# Patient Record
Sex: Female | Born: 1995 | Race: White | Hispanic: No | Smoking: Never smoker
Health system: Southern US, Community
[De-identification: ages and names within clinical notes are randomized; demographics above are authoritative.]

## PROBLEM LIST (undated history)

## (undated) DIAGNOSIS — F329 Major depressive disorder, single episode, unspecified: Secondary | ICD-10-CM

## (undated) DIAGNOSIS — F32A Depression, unspecified: Secondary | ICD-10-CM

## (undated) DIAGNOSIS — F419 Anxiety disorder, unspecified: Secondary | ICD-10-CM

## (undated) HISTORY — DX: Depression, unspecified: F32.A

## (undated) HISTORY — DX: Major depressive disorder, single episode, unspecified: F32.9

## (undated) HISTORY — DX: Anxiety disorder, unspecified: F41.9

## (undated) HISTORY — PX: WISDOM TOOTH EXTRACTION: SHX21

---

## 2017-01-17 ENCOUNTER — Emergency Department (HOSPITAL_COMMUNITY)
Admission: EM | Admit: 2017-01-17 | Discharge: 2017-01-18 | Disposition: A | Discharge status: Home / Self Care | Payer: Managed Care, Other (non HMO) | Attending: Emergency Medicine | Admitting: Emergency Medicine

## 2017-01-17 ENCOUNTER — Encounter (HOSPITAL_COMMUNITY): Payer: Self-pay | Admitting: Nurse Practitioner

## 2017-01-17 DIAGNOSIS — Z79899 Other long term (current) drug therapy: Secondary | ICD-10-CM | POA: Diagnosis not present

## 2017-01-17 DIAGNOSIS — R197 Diarrhea, unspecified: Secondary | ICD-10-CM | POA: Diagnosis not present

## 2017-01-17 DIAGNOSIS — K602 Anal fissure, unspecified: Secondary | ICD-10-CM | POA: Insufficient documentation

## 2017-01-17 DIAGNOSIS — R112 Nausea with vomiting, unspecified: Secondary | ICD-10-CM | POA: Insufficient documentation

## 2017-01-17 DIAGNOSIS — R1011 Right upper quadrant pain: Secondary | ICD-10-CM | POA: Insufficient documentation

## 2017-01-17 DIAGNOSIS — Z880 Allergy status to penicillin: Secondary | ICD-10-CM | POA: Insufficient documentation

## 2017-01-17 LAB — COMPREHENSIVE METABOLIC PANEL
ALBUMIN: 3.5 g/dL (ref 3.5–5.0)
ALK PHOS: 44 U/L (ref 38–126)
ALT: 38 U/L (ref 14–54)
AST: 27 U/L (ref 15–41)
Anion gap: 5 (ref 5–15)
BILIRUBIN TOTAL: 0.4 mg/dL (ref 0.3–1.2)
CALCIUM: 9 mg/dL (ref 8.9–10.3)
CO2: 24 mmol/L (ref 22–32)
CREATININE: 0.66 mg/dL (ref 0.44–1.00)
Chloride: 110 mmol/L (ref 101–111)
GFR calc Af Amer: >60 mL/min (ref >60)
GFR calc non Af Amer: >60 mL/min (ref >60)
GLUCOSE: 101 mg/dL — AB (ref 65–99)
Potassium: 3.9 mmol/L (ref 3.5–5.1)
SODIUM: 139 mmol/L (ref 135–145)
TOTAL PROTEIN: 6.2 g/dL — AB (ref 6.5–8.1)

## 2017-01-17 LAB — URINALYSIS, ROUTINE W REFLEX MICROSCOPIC
Bilirubin Urine: NEGATIVE
GLUCOSE, UA: NEGATIVE mg/dL
Ketones, ur: NEGATIVE mg/dL
Leukocytes, UA: NEGATIVE
NITRITE: NEGATIVE
PROTEIN: NEGATIVE mg/dL
SPECIFIC GRAVITY, URINE: 1.014 (ref 1.005–1.030)
WBC UA: NONE SEEN WBC/hpf (ref 0–5)
pH: 6 (ref 5.0–8.0)

## 2017-01-17 LAB — CBC
HCT: 38.2 % (ref 36.0–46.0)
Hemoglobin: 13 g/dL (ref 12.0–15.0)
MCH: 31.6 pg (ref 26.0–34.0)
MCHC: 34 g/dL (ref 30.0–36.0)
MCV: 92.7 fL (ref 78.0–100.0)
Platelets: 240 10*3/uL (ref 150–400)
RBC: 4.12 MIL/uL (ref 3.87–5.11)
RDW: 11.9 % (ref 11.5–15.5)
WBC: 4.5 10*3/uL (ref 4.0–10.5)

## 2017-01-17 LAB — I-STAT BETA HCG BLOOD, ED (MC, WL, AP ONLY)

## 2017-01-17 NOTE — ED Triage Notes (Signed)
Pt presents with c/o abdominal pain. The pain began about two weeks ago and has been increasingly worse since onset. Shes also had nausea, vomiting, bloody stools this week and she reports about 20 lb weight loss since the beginning of august.

## 2017-01-18 ENCOUNTER — Emergency Department (HOSPITAL_COMMUNITY): Payer: Managed Care, Other (non HMO)

## 2017-01-18 LAB — LIPASE, BLOOD: LIPASE: 32 U/L (ref 11–51)

## 2017-01-18 MED ORDER — DICYCLOMINE HCL 20 MG PO TABS: 20.0000 mg | ORAL_TABLET | Freq: Two times a day (BID) | ORAL | 0 refills | Status: DC

## 2017-01-18 MED ORDER — OMEPRAZOLE 20 MG PO CPDR: 20.0000 mg | DELAYED_RELEASE_CAPSULE | Freq: Every day | ORAL | 0 refills | Status: DC

## 2017-01-18 MED ORDER — KETOROLAC TROMETHAMINE 30 MG/ML IJ SOLN
30.0000 mg | Freq: Once | INTRAMUSCULAR | Status: AC
  Administered 2017-01-18: 30 mg via INTRAVENOUS
  Filled 2017-01-18: qty 1

## 2017-01-18 MED ORDER — SODIUM CHLORIDE 0.9 % IV BOLUS (SEPSIS)
1000.0000 mL | Freq: Once | INTRAVENOUS | Status: AC
  Administered 2017-01-18: 1000 mL via INTRAVENOUS

## 2017-01-18 MED ORDER — ONDANSETRON HCL 4 MG/2ML IJ SOLN
4.0000 mg | Freq: Once | INTRAMUSCULAR | Status: AC
  Administered 2017-01-18: 4 mg via INTRAVENOUS
  Filled 2017-01-18: qty 2

## 2017-01-18 MED ORDER — PROMETHAZINE HCL 25 MG PO TABS: 25.0000 mg | ORAL_TABLET | Freq: Four times a day (QID) | ORAL | 0 refills | Status: DC | PRN

## 2017-01-18 MED ORDER — MORPHINE SULFATE (PF) 4 MG/ML IV SOLN
4.0000 mg | Freq: Once | INTRAVENOUS | Status: AC
  Administered 2017-01-18: 4 mg via INTRAVENOUS
  Filled 2017-01-18: qty 1

## 2017-01-18 NOTE — ED Notes (Signed)
Patient transported to Ultrasound 

## 2017-01-18 NOTE — ED Notes (Signed)
Patient transported to CT 

## 2017-01-18 NOTE — ED Provider Notes (Signed)
195:722 AM 21 year old female assumed in care [place]. Patient presenting for multiple nonspecific complaints. She notes 2 weeks of worsening right upper quadrant pain with nausea and vomiting. This is worse with eating. She has also had watery diarrhea. Workup today revealed hematuria. Patient did state that she was starting her menstrual cycle soon. Otherwise, labs reassuring. No leukocytosis. Vital stable.  After reassuring ultrasound, CT ordered to evaluate for kidney stone. This is negative. There is no evidence of acute abdominal or pelvic process on CT today. Patient sleeping on repeat assessment. She is in no visible or audible signs of discomfort. Will refer to gastroenterology for follow-up should symptoms persist. Supportive management advised with return if symptoms worsen. Patient discharged in stable condition with no unaddressed concerns.    Results for orders placed or performed during the hospital encounter of 01/17/17  Comprehensive metabolic panel  Result Value Ref Range   Sodium 139 135 - 145 mmol/L   Potassium 3.9 3.5 - 5.1 mmol/L   Chloride 110 101 - 111 mmol/L   CO2 24 22 - 32 mmol/L   Glucose, Bld 101 (H) 65 - 99 mg/dL   BUN <5 (L) 6 - 20 mg/dL   Creatinine, Ser 1.610.66 0.44 - 1.00 mg/dL   Calcium 9.0 8.9 - 09.610.3 mg/dL   Total Protein 6.2 (L) 6.5 - 8.1 g/dL   Albumin 3.5 3.5 - 5.0 g/dL   AST 27 15 - 41 U/L   ALT 38 14 - 54 U/L   Alkaline Phosphatase 44 38 - 126 U/L   Total Bilirubin 0.4 0.3 - 1.2 mg/dL   GFR calc non Af Amer >60 >60 mL/min   GFR calc Af Amer >60 >60 mL/min   Anion gap 5 5 - 15  CBC  Result Value Ref Range   WBC 4.5 4.0 - 10.5 K/uL   RBC 4.12 3.87 - 5.11 MIL/uL   Hemoglobin 13.0 12.0 - 15.0 g/dL   HCT 04.538.2 40.936.0 - 81.146.0 %   MCV 92.7 78.0 - 100.0 fL   MCH 31.6 26.0 - 34.0 pg   MCHC 34.0 30.0 - 36.0 g/dL   RDW 91.411.9 78.211.5 - 95.615.5 %   Platelets 240 150 - 400 K/uL  Urinalysis, Routine w reflex microscopic  Result Value  Ref Range   Color, Urine YELLOW YELLOW   APPearance HAZY (A) CLEAR   Specific Gravity, Urine 1.014 1.005 - 1.030   pH 6.0 5.0 - 8.0   Glucose, UA NEGATIVE NEGATIVE mg/dL   Hgb urine dipstick LARGE (A) NEGATIVE   Bilirubin Urine NEGATIVE NEGATIVE   Ketones, ur NEGATIVE NEGATIVE mg/dL   Protein, ur NEGATIVE NEGATIVE mg/dL   Nitrite NEGATIVE NEGATIVE   Leukocytes, UA NEGATIVE NEGATIVE   RBC / HPF TOO NUMEROUS TO COUNT 0 - 5 RBC/hpf   WBC, UA NONE SEEN 0 - 5 WBC/hpf   Bacteria, UA RARE (A) NONE SEEN   Squamous Epithelial / LPF 0-5 (A) NONE SEEN   Mucous PRESENT   Lipase, blood  Result Value Ref Range   Lipase 32 11 - 51 U/L  I-Stat beta hCG blood, ED  Result Value Ref Range   I-stat hCG, quantitative <5.0 <5 mIU/mL   Comment 3           [place] Abdomen Limited  Result Date: 01/18/2017 CLINICAL DATA:  Subacute onset of right upper quadrant abdominal pain. Initial encounter. EXAM: ULTRASOUND ABDOMEN LIMITED RIGHT UPPER QUADRANT COMPARISON:  None. FINDINGS: Gallbladder: No gallstones  or wall thickening visualized. No sonographic Murphy sign noted by sonographer, though evaluation for ultrasonographic Murphy's sign is limited as the patient is on pain medication. Common bile duct: Diameter: 0.3 cm, within normal limits in caliber. Liver: No focal lesion identified. Within normal limits in parenchymal echogenicity. IMPRESSION: Unremarkable ultrasound of the right upper quadrant. Electronically Signed   By: Roanna Raider M.D.   On: 01/18/2017 01:19   Ct Renal Stone Study  Result Date: 01/18/2017 CLINICAL DATA:  Abdominal pain with weight loss for 2 weeks bloody stool EXAM: CT ABDOMEN AND PELVIS WITHOUT CONTRAST TECHNIQUE: Multidetector CT imaging of the abdomen and pelvis was performed following the standard protocol without IV contrast. COMPARISON:  None. FINDINGS: Lower chest: No acute abnormality. Hepatobiliary: No focal liver abnormality is seen. No gallstones, gallbladder wall thickening, or  biliary dilatation. Pancreas: Unremarkable. No pancreatic ductal dilatation or surrounding inflammatory changes. Spleen: Normal in size without focal abnormality. Adrenals/Urinary Tract: Adrenal glands are unremarkable. Kidneys are normal, without renal calculi, focal lesion, or hydronephrosis. Bladder is unremarkable. Stomach/Bowel: Stomach is within normal limits. Appendix appears normal. No evidence of bowel wall thickening, distention, or inflammatory changes. Vascular/Lymphatic: No significant vascular findings are present. No enlarged abdominal or pelvic lymph nodes. Reproductive: Uterus and bilateral adnexa are unremarkable. Other: No abdominal wall hernia or abnormality. No abdominopelvic ascites. Musculoskeletal: No acute or significant osseous findings. IMPRESSION: No CT evidence for acute intra-abdominal or pelvic pathology. Electronically Signed   By: Jasmine Pang M.D.   On: 01/18/2017 03:52      Antony Madura, PA-C 01/18/17 1610    Zadie Rhine, MD 01/19/17 [id number]

## 2017-01-18 NOTE — ED Provider Notes (Signed)
[facility]-EMERGENCY DEPT Provider Note   CSN: [id number] Arrival date & time: 01/17/17  1750     History   Chief Complaint Chief Complaint  Patient presents with  . Abdominal Pain    HPI Brenda Hayes is a 21 y.o. female presenting with 2 weeks of worsening right upper quadrant pain with associated nausea vomiting. Watery non-bloody diarrhea for the last 2 days. Pain is aggravated by eating. Patient has not been able to eat over the past 2 weeks and reports significant weight loss. She reports subjective fevers. She states that she has been told in the past that she had an inflamed gallbladder but didn't require surgery. This pain is similar although more severe. She also reports noting bright red blood in her stool yesterday which is what got her concerned enough to come and be evaluated. Patient denies any history of abdominal surgeries.    HPI  History reviewed. No pertinent past medical history.  There are no active problems to display for this patient.   History reviewed. No pertinent surgical history.  OB History    No data available       Home Medications    Prior to Admission medications   Medication Sig Start Date End Date Taking? Authorizing Provider  albuterol (PROVENTIL HFA;VENTOLIN HFA) 108 (90 Base) MCG/ACT inhaler Inhale 1-2 puffs into the lungs every 6 (six) hours as needed for wheezing or shortness of breath.   Yes [provider]  albuterol (PROVENTIL) (2.5 MG/3ML) 0.083% nebulizer solution Take 2.5 mg by nebulization every 6 (six) hours as needed for wheezing or shortness of breath.   Yes [provider]  Norgestimate-Ethinyl Estradiol Triphasic (TRINESSA LO) 0.18/0.215/0.25 MG-25 MCG tab Take 1 tablet by mouth daily.   Yes [provider]    Family History History reviewed. No pertinent family history.  Social History Social History  Substance Use Topics  . Smoking status: Never Smoker  . Smokeless tobacco: Never Used  .  Alcohol use No     Allergies   Amoxicillin and Penicillins   Review of Systems Review of Systems  Constitutional: Positive for appetite change, fatigue, fever and unexpected weight change. Negative for chills.  Respiratory: Negative for cough, shortness of breath, wheezing and stridor.   Cardiovascular: Negative for chest pain, palpitations and leg swelling.  Gastrointestinal: Positive for abdominal pain, blood in stool, diarrhea, nausea and vomiting. Negative for abdominal distention.  Genitourinary: Negative for difficulty urinating, dysuria, flank pain, frequency, hematuria, pelvic pain and urgency.  Musculoskeletal: Negative for arthralgias, back pain, myalgias, neck pain and neck stiffness.  Skin: Negative for color change, pallor, rash and wound.  Neurological: Negative for dizziness, seizures, syncope, weakness and light-headedness.     Physical Exam Updated Vital Signs BP 136/76   Pulse 91   Temp 98.3 F (36.8 C) (Oral)   Resp 16   LMP 12/20/2016   SpO2 98%   Physical Exam  Constitutional: She appears well-developed and well-nourished. No distress.  She is afebrile, nontoxic-appearing, lying in bed in discomfort no acute distress.  HENT:  Head: Normocephalic and atraumatic.  Eyes: Conjunctivae are normal.  Neck: Neck supple.  Cardiovascular: Normal rate, regular rhythm and normal heart sounds.   No murmur heard. Pulmonary/Chest: Effort normal and breath sounds normal. No respiratory distress. She has no wheezes. She has no rales. She exhibits no tenderness.  Abdominal: Soft. Bowel sounds are normal. She exhibits no distension and no mass. There is tenderness. There is no rebound and no guarding.  Flat contour, active bowel sounds. abdomen is supple and tender to both light and deep palpation of RUQ. no rebound tenderness. No palpated masses. No costovertebral angle tenderness. Positive murphy's sign Negative McBurney's point tenderness  No peritoneal signs    Genitourinary:  Genitourinary Comments: Small anal fissure noted at 5 o'clock. Possible Internal hemorrhoid at 3 o'clock. No external hemorrhoid visualized or gross blood.  Musculoskeletal: Normal range of motion. She exhibits no edema.  Neurological: She is alert.  Skin: Skin is warm and dry. No rash noted. She is not diaphoretic. No erythema. No pallor.  Psychiatric: She has a normal mood and affect.  Nursing note and vitals reviewed.    ED Treatments / Results  Labs (all labs ordered are listed, but only abnormal results are displayed) Labs Reviewed  COMPREHENSIVE METABOLIC PANEL - Abnormal; Notable for the following:       Result Value   Glucose, Bld 101 (*)    BUN <5 (*)    Total Protein 6.2 (*)    All other components within normal limits  URINALYSIS, ROUTINE W REFLEX MICROSCOPIC - Abnormal; Notable for the following:    APPearance HAZY (*)    Hgb urine dipstick LARGE (*)    Bacteria, UA RARE (*)    Squamous Epithelial / LPF 0-5 (*)    All other components within normal limits  CBC  LIPASE, BLOOD  I-STAT BETA HCG BLOOD, ED ([facility], [facility], [facility] ONLY)  POC OCCULT BLOOD, ED    EKG  EKG Interpretation None       Radiology No results found.  Procedures Procedures (including critical care time)  Medications Ordered in ED Medications  ondansetron (ZOFRAN) injection 4 mg (4 mg Intravenous Given 01/18/17 0027)  sodium chloride 0.9 % bolus 1,000 mL (1,000 mLs Intravenous New Bag/Given 01/18/17 0027)  morphine 4 MG/ML injection 4 mg (4 mg Intravenous Given 01/18/17 0027)     Initial Impression / Assessment and Plan / ED Course  I have reviewed the triage vital signs and the nursing notes.  Pertinent labs & imaging results that were available during my care of the patient were reviewed by me and considered in my medical decision making (see chart for details).    Patient presents with worsening right upper quadrant pain with postprandial aggravation, similar to previous  biliary colic but more severe.  She has also been vomiting and has had diarrhea for the past 2 days. Also reports noticing blood in toilet bowl once yesterday.  Fissure and possible internal hemorrhoid on exam. Positive murphy's sign.  Suspect cholecystitis. Ordered RUQ ultrasound, IV fluids, analgesia, antiemetic and will reassess.  Normal vital signs, hemodynamically stable. Labs unremarkable. Pending Hemoccult and lipase. Pregnancy negative.  Transferred patient care at end of shift [facility]-C pending RUQ US and completion of IV fluids. Patient may need CT to evaluate for nephrolithiasis. Hematuria on U/A Interventions and dispo dependent on imaging. Final Clinical Impressions(s) / ED Diagnoses   Final diagnoses:  Postprandial RUQ pain  Nausea vomiting and diarrhea  Anal fissure    New Prescriptions New Prescriptions   No medications on file     Gregary CromerMitchell, Jessica B, PA-C 01/18/17 0111    Zadie RhineWickline, Donald, MD 01/18/17 [id number]

## 2017-11-09 ENCOUNTER — Encounter: Payer: Self-pay | Admitting: Obstetrics and Gynecology

## 2017-11-09 ENCOUNTER — Ambulatory Visit (INDEPENDENT_AMBULATORY_CARE_PROVIDER_SITE_OTHER): Payer: Managed Care, Other (non HMO) | Admitting: Obstetrics and Gynecology

## 2017-11-09 VITALS — BP 130/80 | HR 111 | Ht 62.0 in | Wt 197.0 lb

## 2017-11-09 DIAGNOSIS — Z3169 Encounter for other general counseling and advice on procreation: Secondary | ICD-10-CM

## 2017-11-09 DIAGNOSIS — Z818 Family history of other mental and behavioral disorders: Secondary | ICD-10-CM

## 2017-11-09 DIAGNOSIS — N979 Female infertility, unspecified: Secondary | ICD-10-CM

## 2017-11-09 NOTE — Progress Notes (Signed)
Gynecology H&P  PCP: Patient, No Pcp Per  Chief Complaint:  Chief Complaint  Patient presents with  . Consult    Fertility consultation     History of Present Illness: Patient is a 22 y.o. G1P0010 presenting for evaluation of infertility. Patient and partner have been attempting unprotected intercourse for 3 years and actively trying for conception for 3 years.  IPregnancies with current partner yes  Sexual History Dyspareunia: no Use of Lubricant: no Douching: no  Ovulatory Evaluation LMP: Patient's last menstrual period was 10/23/2017 (exact date). Menarche:14 Interval regular 28  days Duration of flow: 6-8 days Heavy Menses: no Clots: no Intermenstrual Bleeding: no Dysmenorrhea: no  Molimina no Ovulation Predictor Kits Positive  no  PCOS  Wt Change: yes, started at 1-2 years ago 45-55lbs Hirsutism: no Acne: no  Hyperprolactinemia Galactorrhea: no Headaches: no Vision Changes:  no  Thyroid Temperature Intolerance: no Constipation or Diarrhea: no Hair Thinning:  no Palpitation:  no  Prior treatments Meds: none Other Therapies: Not applicable  Premature Ovarian Failure Family history of autism, mental retardation, or fragile X: yes several female cousins with autism Family history of premature ovarian failure or early menopause: no Prior radiation or chemotherapy exposoure:  no  Tubal Factor Previous abdominal or pelvic surgery: no Pelvic Pain:  no Endometriosis: no STD: no PID: no Laparoscopy: no Prior HSG: no  Female Factor Sired prior conception:  Yes (fathered prior miscarriage) Semen analysis: no  Contraception none currently, briefly on OCP following miscarriage  Contributing Habits Cigarettes:    Wife -  no    Husband - no Alcohol:    Wife -  no    Husband - no  Review of Systems: 10 point review of systems negative unless otherwise noted in HPI  Past Medical History:  Past Medical History:  Diagnosis Date  . Anxiety   .  Depression     Past Surgical History:  Past Surgical History:  Procedure Laterality Date  . WISDOM TOOTH EXTRACTION     x4 extracted     Family History:  Family History  Problem Relation Age of Onset  . Breast cancer Paternal Aunt   . Breast cancer Maternal Grandmother    Social History:  Social History   Socioeconomic History  . Marital status: Married    Spouse name: Not on file  . Number of children: Not on file  . Years of education: Not on file  . Highest education level: Not on file  Occupational History  . Not on file  Social Needs  . Financial resource strain: Not on file  . Food insecurity:    Worry: Not on file    Inability: Not on file  . Transportation needs:    Medical: Not on file    Non-medical: Not on file  Tobacco Use  . Smoking status: Never Smoker  . Smokeless tobacco: Never Used  Substance and Sexual Activity  . Alcohol use: No  . Drug use: No  . Sexual activity: Yes    Birth control/protection: None  Lifestyle  . Physical activity:    Days per week: Not on file    Minutes per session: Not on file  . Stress: Not on file  Relationships  . Social connections:    Talks on phone: Not on file    Gets together: Not on file    Attends religious service: Not on file    Active member of club or organization: Not on file    Attends  meetings of clubs or organizations: Not on file    Relationship status: Not on file  . Intimate partner violence:    Fear of current or ex partner: Not on file    Emotionally abused: Not on file    Physically abused: Not on file    Forced sexual activity: Not on file  Other Topics Concern  . Not on file  Social History Narrative  . Not on file    Allergies:  Allergies  Allergen Reactions  . Amoxicillin Nausea And Vomiting  . Penicillins Anaphylaxis  . Eggs Or Egg-Derived Products Hives    Medications: Prior to Admission medications   Medication Sig Start Date End Date Taking? Authorizing Provider    albuterol (PROVENTIL HFA;VENTOLIN HFA) 108 (90 Base) MCG/ACT inhaler Inhale 1-2 puffs into the lungs every 6 (six) hours as needed for wheezing or shortness of breath.   Yes [provider]  albuterol (PROVENTIL) (2.5 MG/3ML) 0.083% nebulizer solution Take 2.5 mg by nebulization every 6 (six) hours as needed for wheezing or shortness of breath.   Yes [provider]  buPROPion (WELLBUTRIN SR) 100 MG 12 hr tablet TK 1 T PO QAM 10/16/17  Yes [provider]  LORazepam (ATIVAN) 1 MG tablet TK 1 T PO BID PRA AND PANIC 10/14/17  Yes [provider]  QUEtiapine (SEROQUEL) 50 MG tablet TK 1 T PO QHS PRN 10/14/17  Yes [provider]  dicyclomine (BENTYL) 20 MG tablet Take 1 tablet (20 mg total) by mouth 2 (two) times daily. Take as prescribed, as needed, for abdominal pain/cramping. Patient not taking: Reported on 11/09/2017 01/18/17   Antony Madura, PA-C  Norgestimate-Ethinyl Estradiol Triphasic (TRINESSA LO) 0.18/0.215/0.25 MG-25 MCG tab Take 1 tablet by mouth daily.    [provider]  omeprazole (PRILOSEC) 20 MG capsule Take 1 capsule (20 mg total) by mouth daily. Patient not taking: Reported on 11/09/2017 01/18/17   Antony Madura, PA-C  promethazine (PHENERGAN) 25 MG tablet Take 1 tablet (25 mg total) by mouth every 6 (six) hours as needed for nausea or vomiting. Patient not taking: Reported on 11/09/2017 01/18/17   Antony Madura, PA-C    Physical Exam Vitals: Blood pressure 130/80, pulse (!) 111, height 5\' 2"  (1.575 m), weight 197 lb (89.4 kg), last menstrual period 10/23/2017.  General: NAD HEENT: normocephalic, anicteric Pulmonary: No increased work of breathing Extremities: no edema, erythema, or tenderness Neurologic: Grossly intact Psychiatric: mood appropriate, affect full   Assessment: 22 y.o. G1P0010 presenting for initial infertility evaluatoin Plan: 1) We discussed the underlying etiologies which may be implicated in a couple experiencing  difficulty conceiving.  The average couple will conceive within the span of 1 year with unprotected coitus, with a monthly fecundity rate of 20% or 1 in 5.  Even without further work up or intervention the patient and her partner may be successful in conceiving unassisted, although if an underlying etiology can be identified and addressed fecundity rate may improve.  The work up entails examining for ovulatory function, tubal patency, and ruling out female factor infertility.  These may be looked at concurrently or sequentially.  The downside of sequential work up is that this method may miss issues if more than one compartment is contributing.  She is aware that tubal factor or moderate to severe female factor infertility will require further consultation with a reproductive endocrinologist.  In the case of anovulation, use of Clomid (clomiphen citrate) or Femara (letrazole) were discussed with the understanding the the later  is an off-label, but well supported use.  With either of these drugs the risk of multiples increases from the standard population rate of 2% to approximately 10%, with higher order multiples possible but unlikely.  Both drugs may require some time to titrate to the appropriate dosage to ensure consistent ovulation.  Cycles will be limited to 6 cycles on each drug secondary to decreasing rates of conception after 6 cycles.  In addition should patient be started on ovulation induction with Clomid she was advised to discontinue the drug for any vision changes as this is a rare but potentially permanent side-effect if medication is continued.  We discussed timing of intercourse as well as the use of ovulation predictor kits identify the patient's fertile window each month.     2) Preconception counseling - The patient denies any family history of conditions which would warrant preconception genetic counseling or testing on her or her partner.  Instructed to start prenatal vitamins while trying to  conceive.   - Fragile X  3) Return in about 1 week (around 11/16/2017) for TVUS infertility.    Vena Austria, MD, [facility], [facility]

## 2017-11-09 NOTE — Patient Instructions (Signed)
We discussed the underlying etiologies which may be implicated in a couple experiencing difficulty conceiving.  The average couple will conceive within the span of 1 year with unprotected coitus, with a monthly fecundity rate of 20% or 1 in 5.  Even without further work up or intervention the patient and her partner may be successful in conceiving unassisted, although if an underlying etiology can be identified and addressed fecundity rate may improve.  The work up entails examining for ovulatory function, tubal patency, and ruling out female factor infertility.  These may be looked at concurrently or sequentially.  The downside of sequential work up is that this method may miss issues if more than one compartment is contributing.  She is aware that tubal factor or moderate to severe female factor infertility will require further consultation with a reproductive endocrinologist.  In the case of anovulation, use of Clomid (clomiphen citrate) or Femara (letrazole) were discussed with the understanding the the later is an off-label, but well supported use.  Current recommendation are to use letrozole first line secondary to increased live birth rate compared to clomid for patients with PCOS ("Polycystic Ovarian Syndrome" ACOG Practice Bulletin 194 June 2018).  With either of these drugs the risk of multiples increases from the standard population rate of 2% to approximately 10%, with higher order multiples possible but unlikely.  Both drugs may require some time to titrate to the appropriate dosage to ensure consistent ovulation.  Cycles will be limited to 6 cycles on each drug secondary to decreasing rates of conception after 6 cycles.  In addition should patient be started on ovulation induction with Clomid she was advised to discontinue the drug for any vision changes as this is a rare but potentially permanent side-effect if medication is continued.  Was counseled that letrozole may cause idiopathic bone pain that  generally resolves.  Hot flashes, headaches, and nausea were mentioned as the most commonly encountered side-effects of both drugs.  We discussed timing of intercourse as well as the use of ovulation predictor kits identify the patient's fertile window each month.

## 2017-11-19 LAB — INHERITEST CORE(CF97,SMA,FRAX)

## 2017-11-19 LAB — TSH+PRL+FSH+TESTT+LH+DHEA S...
17-Hydroxyprogesterone: 60 ng/dL
ANDROSTENEDIONE: 184 ng/dL (ref 41–262)
DHEA-SO4: 438.8 ug/dL — ABNORMAL HIGH (ref 110.0–431.7)
FSH: 8 m[IU]/mL
LH: 29.1 m[IU]/mL
Prolactin: 12.8 ng/mL (ref 4.8–23.3)
TESTOSTERONE: 74 ng/dL — AB (ref 8–48)
TSH: 1.28 u[IU]/mL (ref 0.450–4.500)
Testosterone, Free: 3.7 pg/mL (ref 0.0–4.2)

## 2017-11-21 ENCOUNTER — Other Ambulatory Visit: Payer: Self-pay | Admitting: Obstetrics and Gynecology

## 2017-11-21 ENCOUNTER — Encounter (INDEPENDENT_AMBULATORY_CARE_PROVIDER_SITE_OTHER): Payer: Self-pay

## 2017-11-21 ENCOUNTER — Encounter: Payer: Self-pay | Admitting: Obstetrics and Gynecology

## 2017-11-21 DIAGNOSIS — N939 Abnormal uterine and vaginal bleeding, unspecified: Secondary | ICD-10-CM

## 2017-11-21 DIAGNOSIS — N970 Female infertility associated with anovulation: Secondary | ICD-10-CM

## 2017-11-21 NOTE — Telephone Encounter (Signed)
Patient is schedule 11/30/17 at 2pm for TVUS and follow up

## 2017-11-21 NOTE — Telephone Encounter (Signed)
-----   Message from Vena AustriaAndreas Staebler, MD sent at 11/21/2017  1:04 PM EDT ----- Regarding: Ultrasound Ultrasound in the next 1-3 weeks, order in

## 2017-11-22 ENCOUNTER — Encounter: Payer: Self-pay | Admitting: Obstetrics and Gynecology

## 2017-11-29 NOTE — Progress Notes (Signed)
Gynecology Ultrasound Follow Up  Chief Complaint:  Chief Complaint  Patient presents with  . Follow-up     History of Present Illness: Patient is a 22 y.o. 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  Ultrasound demonstrates the following findgins Adnexa: no masses seen, multiple antral follicles in a classic string of pearls pattern Uterus: nonenlarged with normal endometrial stripe  Additional: no free fluid  Review of Systems: ROS  Past Medical History:  Past Medical History:  Diagnosis Date  . Anxiety   . Depression     Past Surgical History:  Past Surgical History:  Procedure Laterality Date  . WISDOM TOOTH EXTRACTION     x4 extracted     Gynecologic History:  No LMP recorded. (Menstrual status: Irregular Periods). Contraception: none  Family History:  Family History  Problem Relation Age of Onset  . Breast cancer Paternal Aunt   . Breast cancer Maternal Grandmother     Social History:  Social History   Socioeconomic History  . Marital status: Married    Spouse name: Not on file  . Number of children: Not on file  . Years of education: Not on file  . Highest education level: Not on file  Occupational History  . Not on file  Social Needs  . Financial resource strain: Not on file  . Food insecurity:    Worry: Not on file    Inability: Not on file  . Transportation needs:    Medical: Not on file    Non-medical: Not on file  Tobacco Use  . Smoking status: Never Smoker  . Smokeless tobacco: Never Used  Substance and Sexual Activity  . Alcohol use: No  . Drug use: No  . Sexual activity: Yes    Birth control/protection: None  Lifestyle  . Physical activity:    Days per week: Not on file    Minutes per session: Not on file  . Stress: Not on file  Relationships  . Social connections:    Talks on phone: Not on file    Gets together: Not on file    Attends religious service: Not on file   Active member of club or organization: Not on file    Attends meetings of clubs or organizations: Not on file    Relationship status: Not on file  . Intimate partner violence:    Fear of current or ex partner: Not on file    Emotionally abused: Not on file    Physically abused: Not on file    Forced sexual activity: Not on file  Other Topics Concern  . Not on file  Social History Narrative  . Not on file    Allergies:  Allergies  Allergen Reactions  . Amoxicillin Nausea And Vomiting  . Penicillins Anaphylaxis  . Eggs Or Egg-Derived Products Hives    Medications: Prior to Admission medications   Medication Sig Start Date End Date Taking? Authorizing Provider  albuterol (PROVENTIL HFA;VENTOLIN HFA) 108 (90 Base) MCG/ACT inhaler Inhale 1-2 puffs into the lungs every 6 (six) hours as needed for wheezing or shortness of breath.    [provider]  albuterol (PROVENTIL) (2.5 MG/3ML) 0.083% nebulizer solution Take 2.5 mg by nebulization every 6 (six) hours as needed for wheezing or shortness of breath.    [provider]  buPROPion (WELLBUTRIN SR) 100 MG 12 hr tablet TK 1 T PO QAM 10/16/17   [provider]  dicyclomine (BENTYL) 20 MG tablet  Take 1 tablet (20 mg total) by mouth 2 (two) times daily. Take as prescribed, as needed, for abdominal pain/cramping. Patient not taking: Reported on 11/09/2017 01/18/17   Antony Madura, PA-C  LORazepam (ATIVAN) 1 MG tablet TK 1 T PO BID PRA AND PANIC 10/14/17   [provider]  Norgestimate-Ethinyl Estradiol Triphasic (TRINESSA LO) 0.18/0.215/0.25 MG-25 MCG tab Take 1 tablet by mouth daily.    [provider]  omeprazole (PRILOSEC) 20 MG capsule Take 1 capsule (20 mg total) by mouth daily. Patient not taking: Reported on 11/09/2017 01/18/17   Antony Madura, PA-C  promethazine (PHENERGAN) 25 MG tablet Take 1 tablet (25 mg total) by mouth every 6 (six) hours as needed for nausea or vomiting. Patient not taking:  Reported on 11/09/2017 01/18/17   Antony Madura, PA-C  QUEtiapine (SEROQUEL) 50 MG tablet TK 1 T PO QHS PRN 10/14/17   [provider]    Physical Exam Vitals: Blood pressure 116/72, pulse 74, height 5\' 2"  (1.575 m), weight 190 lb (86.2 kg), SpO2 99 %.  General: NAD HEENT: normocephalic, anicteric Pulmonary: No increased work of breathing Extremities: no edema, erythema, or tenderness Neurologic: Grossly intact, normal gait Psychiatric: mood appropriate, affect full   Assessment: 22 y.o. G1P0010 No problem-specific Assessment & Plan notes found for this encounter.   Plan: Problem List Items Addressed This Visit    None    Visit Diagnoses    Secondary amenorrhea    -  Primary   PCOS (polycystic ovarian syndrome)          1) Secondary amenorrhea - laboratory evaluation and history most consistent with PCOS.  Ultrasound today is confirmatory. -start provera 10mg  x 10 days to elicit withdrawal bleed - plan on letrozole cycle with next menses  A total of 15 minutes were spent in face-to-face contact with the patient during this encounter with over half of that time devoted to counseling and coordination of care.   Vena Austria, MD, [facility], [facility]

## 2017-11-30 ENCOUNTER — Ambulatory Visit (INDEPENDENT_AMBULATORY_CARE_PROVIDER_SITE_OTHER): Payer: Managed Care, Other (non HMO) | Admitting: Obstetrics and Gynecology

## 2017-11-30 ENCOUNTER — Ambulatory Visit (INDEPENDENT_AMBULATORY_CARE_PROVIDER_SITE_OTHER): Payer: Managed Care, Other (non HMO)

## 2017-11-30 ENCOUNTER — Encounter: Payer: Self-pay | Admitting: Obstetrics and Gynecology

## 2017-11-30 VITALS — BP 116/72 | HR 74 | Ht 62.0 in | Wt 190.0 lb

## 2017-11-30 DIAGNOSIS — N911 Secondary amenorrhea: Secondary | ICD-10-CM

## 2017-11-30 DIAGNOSIS — E282 Polycystic ovarian syndrome: Secondary | ICD-10-CM

## 2017-11-30 DIAGNOSIS — N8302 Follicular cyst of left ovary: Secondary | ICD-10-CM

## 2017-11-30 DIAGNOSIS — N970 Female infertility associated with anovulation: Secondary | ICD-10-CM

## 2017-11-30 DIAGNOSIS — N939 Abnormal uterine and vaginal bleeding, unspecified: Secondary | ICD-10-CM | POA: Diagnosis not present

## 2017-11-30 DIAGNOSIS — N8301 Follicular cyst of right ovary: Secondary | ICD-10-CM | POA: Diagnosis not present

## 2017-11-30 MED ORDER — MEDROXYPROGESTERONE ACETATE 10 MG PO TABS: 10.0000 mg | ORAL_TABLET | Freq: Every day | ORAL | 0 refills | Status: DC

## 2017-11-30 NOTE — Patient Instructions (Signed)
1) Take the 10 days of provera 2) Email me on the first day you start bleeding after finishing the 10 day course [email or web address] 3) I will instruct you when to take the Letrozole and when to come in for lab work

## 2017-12-01 ENCOUNTER — Encounter: Payer: Self-pay | Admitting: Obstetrics and Gynecology

## 2017-12-14 ENCOUNTER — Encounter: Payer: Self-pay | Admitting: Obstetrics and Gynecology

## 2017-12-15 ENCOUNTER — Other Ambulatory Visit: Payer: Self-pay | Admitting: Obstetrics and Gynecology

## 2017-12-15 DIAGNOSIS — N970 Female infertility associated with anovulation: Secondary | ICD-10-CM

## 2017-12-15 MED ORDER — LETROZOLE 2.5 MG PO TABS: 2.5000 mg | ORAL_TABLET | Freq: Every day | ORAL | 0 refills | Status: AC

## 2017-12-15 NOTE — Progress Notes (Signed)
LMP 12/14/17 rx letrozole 2.5mg  day 21 progesterone on August 8/1

## 2017-12-16 ENCOUNTER — Encounter: Payer: Self-pay | Admitting: Obstetrics and Gynecology

## 2017-12-17 ENCOUNTER — Telehealth: Payer: Self-pay | Admitting: Obstetrics and Gynecology

## 2017-12-17 NOTE — Telephone Encounter (Signed)
-----   Message from Vena AustriaAndreas Staebler, MD sent at 12/15/2017  8:47 PM EDT ----- Regarding: Labs Day 21 progesterone on 01/03/2018 order in

## 2017-12-17 NOTE — Telephone Encounter (Signed)
Called and left voice mail for patient to call back to be schedule °

## 2017-12-30 ENCOUNTER — Encounter: Payer: Self-pay | Admitting: Obstetrics and Gynecology

## 2017-12-31 ENCOUNTER — Encounter: Payer: Self-pay | Admitting: Obstetrics and Gynecology

## 2017-12-31 ENCOUNTER — Other Ambulatory Visit: Payer: Managed Care, Other (non HMO)

## 2018-01-03 ENCOUNTER — Other Ambulatory Visit: Payer: Managed Care, Other (non HMO)

## 2018-01-04 LAB — PROGESTERONE: Progesterone: 1.2 ng/mL

## 2018-01-05 ENCOUNTER — Encounter (INDEPENDENT_AMBULATORY_CARE_PROVIDER_SITE_OTHER): Payer: Self-pay

## 2018-01-08 ENCOUNTER — Encounter: Payer: Self-pay | Admitting: Obstetrics and Gynecology

## 2018-01-21 ENCOUNTER — Other Ambulatory Visit: Payer: Self-pay | Admitting: Obstetrics and Gynecology

## 2018-01-21 DIAGNOSIS — N970 Female infertility associated with anovulation: Secondary | ICD-10-CM

## 2018-01-21 MED ORDER — LETROZOLE 2.5 MG PO TABS: 5.0000 mg | ORAL_TABLET | Freq: Every day | ORAL | 0 refills | Status: DC

## 2018-01-21 NOTE — Telephone Encounter (Signed)
Needs to start letrozole today

## 2018-01-21 NOTE — Telephone Encounter (Signed)
Day 21 progesterone on 02/09/19 order is in

## 2018-01-21 NOTE — Telephone Encounter (Signed)
Called and spoke with Patient to schedule appointment. She is wishing to have [facility] do the lab due to her work schedule. She also is wanting to know if she needs to start talk any prescriptions at this time. Please advise

## 2018-01-21 NOTE — Progress Notes (Signed)
LMP 01/18/18 letrozole cycle I 5mg  day 21 progesterone 9/6

## 2018-02-08 ENCOUNTER — Telehealth: Payer: Self-pay

## 2018-02-08 ENCOUNTER — Other Ambulatory Visit: Payer: Managed Care, Other (non HMO)

## 2018-02-08 NOTE — Telephone Encounter (Signed)
Order is in as Active future

## 2018-02-08 NOTE — Telephone Encounter (Signed)
Pt needs order to be put in/released for progesterone.  She needs to have it drawn today.  She is going to a [facility] to have drawn.  [phone]

## 2018-02-09 LAB — PROGESTERONE: Progesterone: 7.7 ng/mL

## 2018-02-21 ENCOUNTER — Other Ambulatory Visit: Payer: Self-pay | Admitting: Obstetrics and Gynecology

## 2018-02-21 DIAGNOSIS — N970 Female infertility associated with anovulation: Secondary | ICD-10-CM

## 2018-02-21 MED ORDER — LETROZOLE 2.5 MG PO TABS: 2.5000 mg | ORAL_TABLET | Freq: Every day | ORAL | 0 refills | Status: AC

## 2018-02-21 NOTE — Progress Notes (Signed)
LMP 02/20/18 Cycle I letrozole 2.5mg  day 21 progesterone 03/12/18

## 2018-02-22 ENCOUNTER — Telehealth: Payer: Self-pay | Admitting: Obstetrics and Gynecology

## 2018-02-22 NOTE — Telephone Encounter (Signed)
Did you explain to her that we use [facility]?  She wants to go to a draw center?

## 2018-02-22 NOTE — Telephone Encounter (Signed)
Patient is requesting order to be sent to through [facility] due to Work obligations.

## 2018-02-22 NOTE — Telephone Encounter (Signed)
[name] She isn't able to leave work to go have her labs drawn. Yes, patient is aware of lab on site with [facility]

## 2018-02-22 NOTE — Telephone Encounter (Signed)
-----   Message from Vena AustriaAndreas Staebler, MD sent at 02/21/2018  3:09 PM EDT ----- Regarding: Labs visit day 21 progesterone 03/12/18

## 2018-03-04 ENCOUNTER — Inpatient Hospital Stay
Admission: AD | Admit: 2018-03-04 | Discharge: 2018-03-07 | DRG: 885 | Disposition: A | Discharge status: Home / Self Care | Payer: 59 | Source: Intra-hospital | Attending: Psychiatry | Admitting: Psychiatry

## 2018-03-04 ENCOUNTER — Other Ambulatory Visit: Payer: Self-pay

## 2018-03-04 ENCOUNTER — Emergency Department
Admission: EM | Admit: 2018-03-04 | Discharge: 2018-03-04 | Disposition: A | Discharge status: Other Acute Inpatient Hospital | Payer: Managed Care, Other (non HMO) | Attending: Emergency Medicine | Admitting: Emergency Medicine

## 2018-03-04 DIAGNOSIS — K219 Gastro-esophageal reflux disease without esophagitis: Secondary | ICD-10-CM | POA: Diagnosis present

## 2018-03-04 DIAGNOSIS — F410 Panic disorder [episodic paroxysmal anxiety] without agoraphobia: Secondary | ICD-10-CM | POA: Diagnosis present

## 2018-03-04 DIAGNOSIS — T1491XA Suicide attempt, initial encounter: Secondary | ICD-10-CM | POA: Diagnosis present

## 2018-03-04 DIAGNOSIS — Z818 Family history of other mental and behavioral disorders: Secondary | ICD-10-CM

## 2018-03-04 DIAGNOSIS — F4310 Post-traumatic stress disorder, unspecified: Secondary | ICD-10-CM | POA: Diagnosis present

## 2018-03-04 DIAGNOSIS — F332 Major depressive disorder, recurrent severe without psychotic features: Secondary | ICD-10-CM

## 2018-03-04 DIAGNOSIS — Z7289 Other problems related to lifestyle: Secondary | ICD-10-CM | POA: Diagnosis present

## 2018-03-04 DIAGNOSIS — F489 Nonpsychotic mental disorder, unspecified: Secondary | ICD-10-CM | POA: Diagnosis present

## 2018-03-04 DIAGNOSIS — F4010 Social phobia, unspecified: Secondary | ICD-10-CM | POA: Diagnosis present

## 2018-03-04 DIAGNOSIS — G43909 Migraine, unspecified, not intractable, without status migrainosus: Secondary | ICD-10-CM | POA: Diagnosis present

## 2018-03-04 DIAGNOSIS — R45851 Suicidal ideations: Secondary | ICD-10-CM | POA: Insufficient documentation

## 2018-03-04 DIAGNOSIS — Z880 Allergy status to penicillin: Secondary | ICD-10-CM | POA: Diagnosis not present

## 2018-03-04 DIAGNOSIS — Z793 Long term (current) use of hormonal contraceptives: Secondary | ICD-10-CM

## 2018-03-04 DIAGNOSIS — Z79899 Other long term (current) drug therapy: Secondary | ICD-10-CM

## 2018-03-04 DIAGNOSIS — Z915 Personal history of self-harm: Secondary | ICD-10-CM | POA: Diagnosis not present

## 2018-03-04 DIAGNOSIS — G4700 Insomnia, unspecified: Secondary | ICD-10-CM | POA: Diagnosis present

## 2018-03-04 DIAGNOSIS — Z630 Problems in relationship with spouse or partner: Secondary | ICD-10-CM

## 2018-03-04 DIAGNOSIS — X838XXA Intentional self-harm by other specified means, initial encounter: Secondary | ICD-10-CM | POA: Diagnosis not present

## 2018-03-04 DIAGNOSIS — Z23 Encounter for immunization: Secondary | ICD-10-CM | POA: Diagnosis not present

## 2018-03-04 DIAGNOSIS — F329 Major depressive disorder, single episode, unspecified: Secondary | ICD-10-CM | POA: Diagnosis present

## 2018-03-04 DIAGNOSIS — Z79811 Long term (current) use of aromatase inhibitors: Secondary | ICD-10-CM

## 2018-03-04 DIAGNOSIS — Z91012 Allergy to eggs: Secondary | ICD-10-CM | POA: Diagnosis not present

## 2018-03-04 DIAGNOSIS — J45909 Unspecified asthma, uncomplicated: Secondary | ICD-10-CM | POA: Diagnosis present

## 2018-03-04 DIAGNOSIS — F429 Obsessive-compulsive disorder, unspecified: Secondary | ICD-10-CM | POA: Diagnosis present

## 2018-03-04 DIAGNOSIS — F32A Depression, unspecified: Secondary | ICD-10-CM

## 2018-03-04 DIAGNOSIS — F419 Anxiety disorder, unspecified: Secondary | ICD-10-CM | POA: Diagnosis present

## 2018-03-04 LAB — URINE DRUG SCREEN, QUALITATIVE (ARMC ONLY)
Amphetamines, Ur Screen: NOT DETECTED
BARBITURATES, UR SCREEN: NOT DETECTED
BENZODIAZEPINE, UR SCRN: POSITIVE — AB
CANNABINOID 50 NG, UR ~~LOC~~: NOT DETECTED
COCAINE METABOLITE, UR ~~LOC~~: NOT DETECTED
MDMA (ECSTASY) UR SCREEN: NOT DETECTED
METHADONE SCREEN, URINE: NOT DETECTED
OPIATE, UR SCREEN: NOT DETECTED
Phencyclidine (PCP) Ur S: NOT DETECTED
TRICYCLIC, UR SCREEN: NOT DETECTED

## 2018-03-04 LAB — COMPREHENSIVE METABOLIC PANEL
ALBUMIN: 4.1 g/dL (ref 3.5–5.0)
ALK PHOS: 75 U/L (ref 38–126)
ALT: 15 U/L (ref 0–44)
ANION GAP: 9 (ref 5–15)
AST: 19 U/L (ref 15–41)
BUN: 12 mg/dL (ref 6–20)
CALCIUM: 8.7 mg/dL — AB (ref 8.9–10.3)
CHLORIDE: 107 mmol/L (ref 98–111)
CO2: 22 mmol/L (ref 22–32)
CREATININE: 0.65 mg/dL (ref 0.44–1.00)
GFR calc Af Amer: >60 mL/min (ref >60)
GFR calc non Af Amer: >60 mL/min (ref >60)
GLUCOSE: 107 mg/dL — AB (ref 70–99)
Potassium: 3.8 mmol/L (ref 3.5–5.1)
SODIUM: 138 mmol/L (ref 135–145)
Total Bilirubin: 0.5 mg/dL (ref 0.3–1.2)
Total Protein: 7.4 g/dL (ref 6.5–8.1)

## 2018-03-04 LAB — CBC
HEMATOCRIT: 36.5 % (ref 35.0–47.0)
HEMOGLOBIN: 13 g/dL (ref 12.0–16.0)
MCH: 32.8 pg (ref 26.0–34.0)
MCHC: 35.6 g/dL (ref 32.0–36.0)
MCV: 92.1 fL (ref 80.0–100.0)
Platelets: 269 10*3/uL (ref 150–440)
RBC: 3.97 MIL/uL (ref 3.80–5.20)
RDW: 13.9 % (ref 11.5–14.5)
WBC: 6.3 10*3/uL (ref 3.6–11.0)

## 2018-03-04 LAB — ACETAMINOPHEN LEVEL

## 2018-03-04 LAB — SALICYLATE LEVEL

## 2018-03-04 LAB — POCT PREGNANCY, URINE: Preg Test, Ur: NEGATIVE

## 2018-03-04 LAB — ETHANOL: Alcohol, Ethyl (B): <10 mg/dL (ref <10)

## 2018-03-04 MED ORDER — MAGNESIUM HYDROXIDE 400 MG/5ML PO SUSP: 30.0000 mL | Freq: Every day | ORAL | Status: DC | PRN

## 2018-03-04 MED ORDER — BUPROPION HCL ER (XL) 150 MG PO TB24
300.0000 mg | ORAL_TABLET | Freq: Every day | ORAL | Status: DC
  Administered 2018-03-04: 300 mg via ORAL
  Filled 2018-03-04: qty 2

## 2018-03-04 MED ORDER — BUPROPION HCL ER (XL) 150 MG PO TB24
300.0000 mg | ORAL_TABLET | Freq: Every day | ORAL | Status: DC
  Administered 2018-03-05 – 2018-03-07 (×3): 300 mg via ORAL
  Filled 2018-03-04 (×3): qty 2

## 2018-03-04 MED ORDER — PANTOPRAZOLE SODIUM 40 MG PO TBEC
40.0000 mg | DELAYED_RELEASE_TABLET | Freq: Every day | ORAL | Status: DC
  Administered 2018-03-05: 40 mg via ORAL
  Filled 2018-03-04: qty 1

## 2018-03-04 MED ORDER — BUSPIRONE HCL 5 MG PO TABS: 10.0000 mg | ORAL_TABLET | Freq: Two times a day (BID) | ORAL | Status: DC

## 2018-03-04 MED ORDER — LORAZEPAM 1 MG PO TABS: 1.0000 mg | ORAL_TABLET | Freq: Once | ORAL | Status: DC

## 2018-03-04 MED ORDER — ALUM & MAG HYDROXIDE-SIMETH 200-200-20 MG/5ML PO SUSP: 30.0000 mL | ORAL | Status: DC | PRN

## 2018-03-04 MED ORDER — LORAZEPAM 1 MG PO TABS
1.0000 mg | ORAL_TABLET | Freq: Once | ORAL | Status: AC
  Administered 2018-03-04: 1 mg via ORAL
  Filled 2018-03-04: qty 1

## 2018-03-04 MED ORDER — ACETAMINOPHEN 325 MG PO TABS
650.0000 mg | ORAL_TABLET | Freq: Four times a day (QID) | ORAL | Status: DC | PRN
  Administered 2018-03-05 – 2018-03-06 (×2): 650 mg via ORAL
  Filled 2018-03-04 (×2): qty 2

## 2018-03-04 MED ORDER — HYDROXYZINE HCL 50 MG PO TABS
50.0000 mg | ORAL_TABLET | Freq: Three times a day (TID) | ORAL | Status: DC | PRN
  Administered 2018-03-04 – 2018-03-06 (×3): 50 mg via ORAL
  Filled 2018-03-04 (×3): qty 1

## 2018-03-04 MED ORDER — ALBUTEROL SULFATE HFA 108 (90 BASE) MCG/ACT IN AERS
1.0000 | INHALATION_SPRAY | Freq: Four times a day (QID) | RESPIRATORY_TRACT | Status: DC | PRN
  Filled 2018-03-04: qty 6.7

## 2018-03-04 MED ORDER — TETANUS-DIPHTH-ACELL PERTUSSIS 5-2.5-18.5 LF-MCG/0.5 IM SUSP
0.5000 mL | Freq: Once | INTRAMUSCULAR | Status: AC
  Administered 2018-03-04: 0.5 mL via INTRAMUSCULAR
  Filled 2018-03-04: qty 0.5

## 2018-03-04 MED ORDER — TRAZODONE HCL 100 MG PO TABS
100.0000 mg | ORAL_TABLET | Freq: Every evening | ORAL | Status: DC | PRN
  Administered 2018-03-04: 100 mg via ORAL
  Filled 2018-03-04: qty 1

## 2018-03-04 MED ORDER — DICYCLOMINE HCL 20 MG PO TABS
20.0000 mg | ORAL_TABLET | Freq: Two times a day (BID) | ORAL | Status: DC
  Administered 2018-03-04 – 2018-03-05 (×2): 20 mg via ORAL
  Filled 2018-03-04 (×3): qty 1

## 2018-03-04 NOTE — ED Notes (Signed)
Patient given her Ativan 1mg , and wellbutrin 300mg . Patient is very anxious and saying "I want to go home". Informed patient that she has been admitted to inpatient unit. Patient is tearful.

## 2018-03-04 NOTE — ED Notes (Signed)
Patient transferring to [facility] room 306B

## 2018-03-04 NOTE — ED Notes (Signed)
Hourly rounding reveals patient sleeping in room. No complaints, stable, in no acute distress. Q15 minute rounds and monitoring via Security Cameras to continue. 

## 2018-03-04 NOTE — ED Notes (Signed)
Jeans, sandals, panties, bra, sweatshirt, t shirt, purse placed into labeled belonging bag. Pt has one left earring, hoop, that cannot be removed from cartilage.

## 2018-03-04 NOTE — ED Triage Notes (Signed)
Pt states she has been cutting self and feels suicidal. Pt tearful in triage. States history of depression. Voluntary at this time.

## 2018-03-04 NOTE — Tx Team (Signed)
Initial Treatment Plan 03/04/2018 5:31 PM Brenda Hayes ZOX:[id number]    PATIENT STRESSORS: Loss of relationship Marital or family conflict   PATIENT STRENGTHS: Average or above average intelligence General fund of knowledge   PATIENT IDENTIFIED PROBLEMS: Major Depressive disorder  Suicidal Ideation                   DISCHARGE CRITERIA:  Improved stabilization in mood, thinking, and/or behavior  PRELIMINARY DISCHARGE PLAN: Outpatient therapy  PATIENT/FAMILY INVOLVEMENT: This treatment plan has been presented to and reviewed with the patient, Brenda Hayes, and/or family member, .  The patient and family have been given the opportunity to ask questions and make suggestions.  Shelia Media, RN 03/04/2018, 5:31 PM

## 2018-03-04 NOTE — ED Provider Notes (Signed)
[facility] Emergency Department Provider Note  Time seen: 2:09 AM  I have reviewed the triage vital signs and the nursing notes.   HISTORY  Chief Complaint Psychiatric Evaluation    HPI Brenda Hayes is a 22 y.o. female with a past medical history of anxiety and depression presents to the emergency department for worsening depression, self-mutilation and suicidal thoughts.  According to the patient she recently broke up with her boyfriend, he told her tonight that he had been cheating on her.  Patient states worsening depression she is now cutting her right forearm.  Having thoughts of killing herself by cutting herself deeply.  Denies any medical complaints today.  Last tetanus shot was approximately 10 years ago we will update in the emergency department.   Past Medical History:  Diagnosis Date  . Anxiety   . Depression     There are no active problems to display for this patient.   Past Surgical History:  Procedure Laterality Date  . WISDOM TOOTH EXTRACTION     x4 extracted     Prior to Admission medications   Medication Sig Start Date End Date Taking? Authorizing Provider  albuterol (PROVENTIL HFA;VENTOLIN HFA) 108 (90 Base) MCG/ACT inhaler Inhale 1-2 puffs into the lungs every 6 (six) hours as needed for wheezing or shortness of breath.    [provider]  albuterol (PROVENTIL) (2.5 MG/3ML) 0.083% nebulizer solution Take 2.5 mg by nebulization every 6 (six) hours as needed for wheezing or shortness of breath.    [provider]  buPROPion (WELLBUTRIN SR) 150 MG 12 hr tablet Take 150 mg by mouth 2 (two) times daily.    [provider]  dicyclomine (BENTYL) 20 MG tablet Take 1 tablet (20 mg total) by mouth 2 (two) times daily. Take as prescribed, as needed, for abdominal pain/cramping. Patient not taking: Reported on 11/09/2017 01/18/17   Antony Madura, PA-C  letrozole [facility]) 2.5 MG tablet Take 2 tablets (5 mg total) by  mouth daily. 01/21/18   Vena Austria, MD  LORazepam (ATIVAN) 1 MG tablet TK 1 T PO BID PRA AND PANIC 10/14/17   [provider]  medroxyPROGESTERone (PROVERA) 10 MG tablet Take 1 tablet (10 mg total) by mouth daily for 10 days. 11/30/17 12/10/17  Vena Austria, MD  Norgestimate-Ethinyl Estradiol Triphasic (TRINESSA LO) 0.18/0.215/0.25 MG-25 MCG tab Take 1 tablet by mouth daily.    [provider]  omeprazole (PRILOSEC) 20 MG capsule Take 1 capsule (20 mg total) by mouth daily. Patient not taking: Reported on 11/09/2017 01/18/17   Antony Madura, PA-C  promethazine (PHENERGAN) 25 MG tablet Take 1 tablet (25 mg total) by mouth every 6 (six) hours as needed for nausea or vomiting. Patient not taking: Reported on 11/09/2017 01/18/17   Antony Madura, PA-C  QUEtiapine (SEROQUEL) 100 MG tablet Take 100 mg by mouth at bedtime.    [provider]    Allergies  Allergen Reactions  . Amoxicillin Nausea And Vomiting  . Penicillins Anaphylaxis  . Eggs Or Egg-Derived Products Hives    Family History  Problem Relation Age of Onset  . Breast cancer Paternal Aunt   . Breast cancer Maternal Grandmother     Social History Social History   Tobacco Use  . Smoking status: Never Smoker  . Smokeless tobacco: Never Used  Substance Use Topics  . Alcohol use: No  . Drug use: No    Review of Systems Constitutional: Negative for fever. Cardiovascular: Negative for chest pain. Respiratory: Negative  for shortness of breath. Gastrointestinal: Negative for abdominal pain Genitourinary: Last menstrual.  1 week ago. Musculoskeletal: Negative for musculoskeletal complaints Skin: Cuts to right forearm. Neurological: Negative for headache All other ROS negative  ____________________________________________   PHYSICAL EXAM:  VITAL SIGNS: ED Triage Vitals [03/04/18 0119]  Enc Vitals Group     BP 128/74     Pulse Rate 83     Resp 16     Temp 98.2 F (36.8 C)     Temp Source  Oral     SpO2 100 %     Weight 200 lb (90.7 kg)     Height 5\' 2"  (1.575 m)     Head Circumference      Peak Flow      Pain Score 0     Pain Loc      Pain Edu?      Excl. in GC?     Constitutional: Alert and oriented. Well appearing and in no distress. Eyes: Normal exam ENT   Head: Normocephalic and atraumatic.   Mouth/Throat: Mucous membranes are moist. Cardiovascular: Normal rate, regular rhythm. No murmur Respiratory: Normal respiratory effort without tachypnea nor retractions. Breath sounds are clear  Gastrointestinal: Soft and nontender. No distention.  Musculoskeletal: Nontender with normal range of motion in all extremities Neurologic:  Normal speech and language. No gross focal neurologic deficits Skin: Superficial cuts to right forearm/wrist, none of which are gaping or require repair Psychiatric: Tearful during examination  ____________________________________________   INITIAL IMPRESSION / ASSESSMENT AND PLAN / ED COURSE  Pertinent labs & imaging results that were available during my care of the patient were reviewed by me and considered in my medical decision making (see chart for details).  Patient presents to the emergency department with suicidal thoughts.  We will place the patient under an IVC.  Patient's labs are reassuring including negative ethanol salicylate and acetaminophen levels, urine drug screen is benzodiazepine positive otherwise negative.  Urine pregnancy is negative.  Tetanus will be updated in the emergency department.  Psychiatric evaluation pending.  Patient care signed out to oncoming physician.  ____________________________________________   FINAL CLINICAL IMPRESSION(S) / ED DIAGNOSES  Suicidal ideation Self-mutilation    Minna Antis, MD 03/04/18 [phone]

## 2018-03-04 NOTE — BH Assessment (Signed)
Patient is to be admitted to [facility] by Dr. Jennet Maduro.  Attending Physician will be Dr. Jennet Maduro.   Patient has been assigned to room 306-A, by [facility] Charge Nurse Lillette Boxer.   Intake Paper Work has been signed and placed on patient chart.  ER staff is aware of the admission:  ER Secretary    Dr. Pershing Proud, ER MD   Geralynn Ochs Patient's Nurse   Ethelene Browns Patient Access.

## 2018-03-04 NOTE — BH Assessment (Signed)
Assessment Note  Brenda Hayes is an 22 y.o. female. Brenda Hayes arrived to the ED by way of [place] law enforcement. She repots, "I was going to kill myself".  She reports that, "It has been accumulating over the past 8 months.  She reports that she found out her husband was cheating  on her.  She states that she confronted him and he lied.  She states that he later left her.  She reports that she started cutting.  She that she and her husband are getting a divorce and she entered into a new relationship and that the young man broke up with her.  She states that she asked him if he had someone else and she found he did and she was triggered by the past feelings with her husband. She reports that she has been cutting today.  She had multiple lacerations on her left arm.  She reports that she has a plan to cut her wrists and to take all her pills to commit suicide.  She states that she contacted the suicide hotline and mobile crisis.  She reports a history of depression.  She currently reports that she has not eaten for 2.5 days.  She reports that she has not been sleeping well.  She reports isolating herself. "I don't feel like being around anyone".  She reports an increase in crying.  She reports being confused and having a difficult time making decisions.  She reports symptoms of anxiety and she reports panic attacks and having difficulty breathing.  She denied having auditory or visual hallucinations.  She denied homicidal ideation or intent.  She reports that her husband put her out of the apartment, and she recently was able to find a place to live.    Diagnosis: Depression, SI  Past Medical History:  Past Medical History:  Diagnosis Date  . Anxiety   . Depression     Past Surgical History:  Procedure Laterality Date  . WISDOM TOOTH EXTRACTION     x4 extracted     Family History:  Family History  Problem Relation Age of Onset  . Breast cancer Paternal Aunt   . Breast cancer Maternal  Grandmother     Social History:  reports that she has never smoked. She has never used smokeless tobacco. She reports that she does not drink alcohol or use drugs.  Additional Social History:  Alcohol / Drug Use History of alcohol / drug use?: No history of alcohol / drug abuse  CIWA: CIWA-Ar BP: 128/74 Pulse Rate: 83 COWS:    Allergies:  Allergies  Allergen Reactions  . Amoxicillin Nausea And Vomiting  . Penicillins Anaphylaxis  . Eggs Or Egg-Derived Products Hives    Home Medications:  (Not in a hospital admission)  OB/GYN Status:  Patient's last menstrual period was 02/25/2018.  General Assessment Data Location of Assessment: [facility] ED TTS Assessment: In system Is this a Tele or Face-to-Face Assessment?: Face-to-Face Is this an Initial Assessment or a Re-assessment for this encounter?: Initial Assessment Patient Accompanied by:: N/A Language Other than English: No Living Arrangements: Other (Comment)(personal housing) What gender do you identify as?: Female Marital status: Separated Maiden name: [place] Pregnancy Status: No Living Arrangements: Alone Can pt return to current living arrangement?: Yes Admission Status: Voluntary Is patient capable of signing voluntary admission?: Yes Referral Source: Self/Family/Friend Insurance type: Product/process development scientist Exam [facility] Walk-in ONLY) Medical Exam completed: Yes  Crisis Care Plan Living Arrangements: Alone Legal Guardian: Other:(Selff) Name of Psychiatrist: Dr.  Adron Bene - [facility] Name of Therapist: None  Education Status Is patient currently in school?: No Is the patient employed, unemployed or receiving disability?: Employed([employer])  Risk to self with the past 6 months Suicidal Ideation: Yes-Currently Present Has patient been a risk to self within the past 6 months prior to admission? : Yes Suicidal Intent: Yes-Currently Present Has patient had any suicidal intent within  the past 6 months prior to admission? : Yes Is patient at risk for suicide?: Yes Suicidal Plan?: Yes-Currently Present Has patient had any suicidal plan within the past 6 months prior to admission? : Yes Specify Current Suicidal Plan: Cut wrist and take all her pills Access to Means: No(Currently in hospital) What has been your use of drugs/alcohol within the last 12 months?: Denied use Previous Attempts/Gestures: Yes How many times?: 1 Other Self Harm Risks: Cutting Triggers for Past Attempts: Spouse contact Intentional Self Injurious Behavior: Cutting Comment - Self Injurious Behavior: history of cutting Family Suicide History: Yes(Grandfather shot himself) Recent stressful life event(s): Conflict (Comment), Other (Comment)(seperation from husband, break up with boyfriend) Persecutory voices/beliefs?: No Depression: Yes Depression Symptoms: Despondent, Insomnia, Tearfulness, Isolating, Feeling worthless/self pity Substance abuse history and/or treatment for substance abuse?: No Suicide prevention information given to non-admitted patients: Not applicable  Risk to Others within the past 6 months Homicidal Ideation: No Does patient have any lifetime risk of violence toward others beyond the six months prior to admission? : No Thoughts of Harm to Others: No Current Homicidal Intent: No Current Homicidal Plan: No Access to Homicidal Means: No Identified Victim: None identified History of harm to others?: No Assessment of Violence: None Noted Does patient have access to weapons?: No Criminal Charges Pending?: No Does patient have a court date: No Is patient on probation?: No  Psychosis Hallucinations: None noted Delusions: None noted  Mental Status Report Appearance/Hygiene: In scrubs Eye Contact: Fair Motor Activity: Unremarkable Speech: Soft, Slow Level of Consciousness: Alert Mood: Depressed Affect: Flat Anxiety Level: Minimal Thought Processes: Coherent Judgement:  Partial Orientation: Appropriate for developmental age Obsessive Compulsive Thoughts/Behaviors: None  Cognitive Functioning Concentration: Decreased Memory: Recent Intact Is patient IDD: No Insight: Fair Impulse Control: Fair Appetite: Poor Sleep: Decreased Vegetative Symptoms: None  ADLScreening [facility] Assessment Services) Patient's cognitive ability adequate to safely complete daily activities?: Yes Patient able to express need for assistance with ADLs?: Yes Independently performs ADLs?: Yes (appropriate for developmental age)  Prior Inpatient Therapy Prior Inpatient Therapy: No  Prior Outpatient Therapy Prior Outpatient Therapy: Yes Prior Therapy Dates: Current Prior Therapy [facility] Reason for Treatment: Depression Does patient have an ACCT team?: No Does patient have Intensive In-House Services?  : No Does patient have [facility] services? : No Does patient have P4CC services?: No  ADL Screening (condition at time of admission) Patient's cognitive ability adequate to safely complete daily activities?: Yes Is the patient deaf or have difficulty hearing?: No Does the patient have difficulty seeing, even when wearing glasses/contacts?: No Does the patient have difficulty concentrating, remembering, or making decisions?: Yes Patient able to express need for assistance with ADLs?: Yes Does the patient have difficulty dressing or bathing?: No Independently performs ADLs?: Yes (appropriate for developmental age) Does the patient have difficulty walking or climbing stairs?: No Weakness of Legs: Both(Reports weakness and radiation of pain in joints when stressed) Weakness of Arms/Hands: Both(Reports weakness and radiation of pain in joints when stressed)  Home Assistive Devices/Equipment Home Assistive Devices/Equipment: Nebulizer  Abuse/Neglect Assessment (Assessment to be complete while patient is alone) Abuse/Neglect  Assessment Can Be Completed: (Denied by patient)     Advance Directives (For Healthcare) Does Patient Have a Medical Advance Directive?: No          Disposition:  Disposition Initial Assessment Completed for this Encounter: Yes  On Site Evaluation by:   Reviewed with Physician:    Vitali Deeds 03/04/2018 2:49 AM

## 2018-03-04 NOTE — H&P (Addendum)
Psychiatric Admission Assessment Adult  Patient Identification: Cathaleen Korol MRN:  [id number] Date of Evaluation:  03/04/2018 Chief Complaint:  Depression Principal Diagnosis: Major depressive disorder, recurrent severe without psychotic features ([place]) Diagnosis:   Patient Active Problem List   Diagnosis Date Noted  . Major depressive disorder, recurrent severe without psychotic features ([place]) [F33.2] 03/04/2018    Priority: High  . Self-injurious behavior [F48.9] 03/04/2018  . Suicide attempt [place]) [T14.91XA] 03/04/2018   History of Present Illness:   Identifying data. Ms. Amey is a 22 year old female with a history of depression and anxiety.  Chief complaint. "I cut to die."  History of present illness. Information was obtained from the patient and the chart. The patient came to the ER after an episode of cutting her forearm superficially. This was precipitated by brake up with a boyfriend who cheated on her. Last time she cut, was three weeks ago. This time, the patient cut very close to her wrist and felt it was dangerous. She reports many symptoms of depression with poor sleep, increased appetite anhedonia feeling of guilt hopelessness worthlessness, poor energy and concentration social isolation, crying spells and now suicidal thinking.She denies frank psychotic symptoms but did see "things" in the past. No auditory hallucinations. She reports frequent panic attacks, social anxiety, PTSD since the age of 32 when she witnessed her mother killed, and OCD type symptoms with excessive worries checking, cleaning organizing, list making. She reports frequent and profound mood swings as well but no full blown mania. No alcohol or substance abuse.  Past psychiatric history. In treatment since the age of 79. She has been in the care of Dr. Kasandra Knudsen prescribed Wellbutrin 300 mg, Ativan 1 mg BID and BuSpar. Took Seroquel, Zoloft, Prozac and Lexapro in the past.  Family psychiatric history.  Grandfather and aunt attempted suicide.  Social history. Divorced. Works at [employer]. Lives independently.  Total Time spent with patient: 1 hour  Is the patient at risk to self? Yes.    Has the patient been a risk to self in the past 6 months? Yes.    Has the patient been a risk to self within the distant past? Yes.    Is the patient a risk to others? No.  Has the patient been a risk to others in the past 6 months? No.  Has the patient been a risk to others within the distant past? No.   Prior Inpatient Therapy:   Prior Outpatient Therapy:    Alcohol Screening: 1. How often do you have a drink containing alcohol?: Never 2. How many drinks containing alcohol do you have on a typical day when you are drinking?: 1 or 2 3. How often do you have six or more drinks on one occasion?: Never AUDIT-C Score: 0 4. How often during the last year have you found that you were not able to stop drinking once you had started?: Never 5. How often during the last year have you failed to do what was normally expected from you becasue of drinking?: Never 6. How often during the last year have you needed a first drink in the morning to get yourself going after a heavy drinking session?: Never 7. How often during the last year have you had a feeling of guilt of remorse after drinking?: Never 8. How often during the last year have you been unable to remember what happened the night before because you had been drinking?: Never 9. Have you or someone else been injured as a result of your  drinking?: No 10. Has a relative or friend or a doctor or another health worker been concerned about your drinking or suggested you cut down?: No Alcohol Use Disorder Identification Test Final Score (AUDIT): 0 Intervention/Follow-up: AUDIT Score <7 follow-up not indicated, Continued Monitoring Substance Abuse History in the last 12 months:  No. Consequences of Substance Abuse: NA Previous Psychotropic Medications: Yes   Psychological Evaluations: No  Past Medical History:  Past Medical History:  Diagnosis Date  . Anxiety   . Depression     Past Surgical History:  Procedure Laterality Date  . WISDOM TOOTH EXTRACTION     x4 extracted    Family History:  Family History  Problem Relation Age of Onset  . Breast cancer Paternal Aunt   . Breast cancer Maternal Grandmother    Tobacco Screening: Have you used any form of tobacco in the last 30 days? (Cigarettes, Smokeless Tobacco, Cigars, and/or Pipes): No Social History:  Social History   Substance and Sexual Activity  Alcohol Use No     Social History   Substance and Sexual Activity  Drug Use No    Additional Social History:                           Allergies:   Allergies  Allergen Reactions  . Amoxicillin Nausea And Vomiting  . Penicillins Anaphylaxis    Has patient had a PCN reaction causing immediate rash, facial/tongue/throat swelling, SOB or lightheadedness with hypotension: Yes Has patient had a PCN reaction causing severe rash involving mucus membranes or skin necrosis: Unknown Has patient had a PCN reaction that required hospitalization: Unknown Has patient had a PCN reaction occurring within the last 10 years: No If all of the above answers are "NO", then may proceed with Cephalosporin use.   . Eggs Or Egg-Derived Products Hives   Lab Results:  Results for orders placed or performed during the hospital encounter of 03/04/18 (from the past 48 hour(s))  Comprehensive metabolic panel     Status: Abnormal   Collection Time: 03/04/18  1:26 AM  Result Value Ref Range   Sodium 138 135 - 145 mmol/L   Potassium 3.8 3.5 - 5.1 mmol/L   Chloride 107 98 - 111 mmol/L   CO2 22 22 - 32 mmol/L   Glucose, Bld 107 (H) 70 - 99 mg/dL   BUN 12 6 - 20 mg/dL   Creatinine, Ser 0.65 0.44 - 1.00 mg/dL   Calcium 8.7 (L) 8.9 - 10.3 mg/dL   Total Protein 7.4 6.5 - 8.1 g/dL   Albumin 4.1 3.5 - 5.0 g/dL   AST 19 15 - 41 U/L   ALT 15 0 -  44 U/L   Alkaline Phosphatase 75 38 - 126 U/L   Total Bilirubin 0.5 0.3 - 1.2 mg/dL   GFR calc non Af Amer >60 >60 mL/min   GFR calc Af Amer >60 >60 mL/min    Comment: (NOTE) The eGFR has been calculated using the CKD EPI equation. This calculation has not been validated in all clinical situations. eGFR's persistently <60 mL/min signify possible Chronic Kidney Disease.    Anion gap 9 5 - 15    Comment: Performed at [facility], [address] [place], [place] [address]  Ethanol     Status: None   Collection Time: 03/04/18  1:26 AM  Result Value Ref Range   Alcohol, Ethyl (B) <10 <10 mg/dL    Comment: (NOTE) Lowest detectable limit  for serum alcohol is 10 mg/dL. For medical purposes only. Performed at [facility], [address] [place], [place] [address]   Salicylate level     Status: None   Collection Time: 03/04/18  1:26 AM  Result Value Ref Range   Salicylate Lvl <3.8 2.8 - 30.0 mg/dL    Comment: Performed at [facility], [place], [place] [address]  Acetaminophen level     Status: Abnormal   Collection Time: 03/04/18  1:26 AM  Result Value Ref Range   Acetaminophen (Tylenol), Serum <10 (L) 10 - 30 ug/mL    Comment: (NOTE) Therapeutic concentrations vary significantly. A range of 10-30 ug/mL  may be an effective concentration for many patients. However, some  are best treated at concentrations outside of this range. Acetaminophen concentrations >150 ug/mL at 4 hours after ingestion  and >50 ug/mL at 12 hours after ingestion are often associated with  toxic reactions. Performed at [facility], [address] [place], [place] [address]   cbc     Status: None   Collection Time: 03/04/18  1:26 AM  Result Value Ref Range   WBC 6.3 3.6 - 11.0 K/uL   RBC 3.97 3.80 - 5.20 MIL/uL   Hemoglobin 13.0 12.0 - 16.0 g/dL   HCT 36.5 35.0 - 47.0 %   MCV 92.1 80.0 - 100.0 fL   MCH 32.8 26.0 - 34.0 pg   MCHC 35.6 32.0 -  36.0 g/dL   RDW 13.9 11.5 - 14.5 %   Platelets 269 150 - 440 K/uL    Comment: Performed at [facility], [address], [place], [place] [address]  Urine Drug Screen, Qualitative     Status: Abnormal   Collection Time: 03/04/18  1:26 AM  Result Value Ref Range   Tricyclic, Ur Screen NONE DETECTED NONE DETECTED   Amphetamines, Ur Screen NONE DETECTED NONE DETECTED   MDMA (Ecstasy)Ur Screen NONE DETECTED NONE DETECTED   Cocaine Metabolite,Ur [place] NONE DETECTED NONE DETECTED   Opiate, Ur Screen NONE DETECTED NONE DETECTED   Phencyclidine (PCP) Ur S NONE DETECTED NONE DETECTED   Cannabinoid 50 Ng, Ur [place] NONE DETECTED NONE DETECTED   Barbiturates, Ur Screen NONE DETECTED NONE DETECTED   Benzodiazepine, Ur Scrn POSITIVE (A) NONE DETECTED   Methadone Scn, Ur NONE DETECTED NONE DETECTED    Comment: (NOTE) Tricyclics + metabolites, urine    Cutoff 1000 ng/mL Amphetamines + metabolites, urine  Cutoff 1000 ng/mL MDMA (Ecstasy), urine              Cutoff 500 ng/mL Cocaine Metabolite, urine          Cutoff 300 ng/mL Opiate + metabolites, urine        Cutoff 300 ng/mL Phencyclidine (PCP), urine         Cutoff 25 ng/mL Cannabinoid, urine                 Cutoff 50 ng/mL Barbiturates + metabolites, urine  Cutoff 200 ng/mL Benzodiazepine, urine              Cutoff 200 ng/mL Methadone, urine                   Cutoff 300 ng/mL The urine drug screen provides only a preliminary, unconfirmed analytical test result and should not be used for non-medical purposes. Clinical consideration and professional judgment should be applied to any positive drug screen result due to possible interfering substances. A more specific  alternate chemical method must be used in order to obtain a confirmed analytical result. Gas chromatography / mass spectrometry (GC/MS) is the preferred confirmat ory method. Performed at [facility], [address] [place], [place] [address]   Pregnancy, urine POC      Status: None   Collection Time: 03/04/18  1:31 AM  Result Value Ref Range   Preg Test, Ur NEGATIVE NEGATIVE    Comment:        THE SENSITIVITY OF THIS METHODOLOGY IS >24 mIU/mL     Blood Alcohol level:  Lab Results  Component Value Date   ETH <10 03/21/5101    Metabolic Disorder Labs:  No results found for: HGBA1C, MPG Lab Results  Component Value Date   PROLACTIN 12.8 11/09/2017   No results found for: CHOL, TRIG, HDL, CHOLHDL, VLDL, LDLCALC  Current Medications: Current Facility-Administered Medications  Medication Dose Route Frequency Provider Last Rate Last Dose  . acetaminophen (TYLENOL) tablet 650 mg  650 mg Oral Q6H PRN Derin Matthes B, MD      . albuterol (PROVENTIL HFA;VENTOLIN HFA) 108 (90 Base) MCG/ACT inhaler 1-2 puff  1-2 puff Inhalation Q6H PRN Yitzchak Kothari B, MD      . alum & mag hydroxide-simeth (MAALOX/MYLANTA) 200-200-20 MG/5ML suspension 30 mL  30 mL Oral Q4H PRN Heru Montz B, MD      . Derrill Memo ON 03/05/2018] buPROPion (WELLBUTRIN XL) 24 hr tablet 300 mg  300 mg Oral Daily Christa Fasig B, MD      . dicyclomine (BENTYL) tablet 20 mg  20 mg Oral BID Makyi Ledo B, MD   20 mg at 03/04/18 2128  . hydrOXYzine (ATARAX/VISTARIL) tablet 50 mg  50 mg Oral TID PRN Jenai Scaletta B, MD   50 mg at 03/04/18 2128  . magnesium hydroxide (MILK OF MAGNESIA) suspension 30 mL  30 mL Oral Daily PRN Ronasia Isola B, MD      . Derrill Memo ON 03/05/2018] pantoprazole (PROTONIX) EC tablet 40 mg  40 mg Oral Daily Mehgan Santmyer B, MD      . traZODone (DESYREL) tablet 100 mg  100 mg Oral QHS PRN Theran Vandergrift B, MD   100 mg at 03/04/18 2128   PTA Medications: Medications Prior to Admission  Medication Sig Dispense Refill Last Dose  . albuterol (PROVENTIL HFA;VENTOLIN HFA) 108 (90 Base) MCG/ACT inhaler Inhale 1-2 puffs into the lungs every 6 (six) hours as needed for wheezing or shortness of breath.   PRN at PRN  . albuterol  (PROVENTIL) (2.5 MG/3ML) 0.083% nebulizer solution Take 2.5 mg by nebulization every 6 (six) hours as needed for wheezing or shortness of breath.   PRN at PRN  . buPROPion (WELLBUTRIN XL) 300 MG 24 hr tablet Take 300 mg by mouth daily.  1 03/03/2018 at 0800  . busPIRone (BUSPAR) 10 MG tablet Take 10 mg by mouth 2 (two) times daily.  3 03/03/2018 at 2000  . dicyclomine (BENTYL) 20 MG tablet Take 1 tablet (20 mg total) by mouth 2 (two) times daily. Take as prescribed, as needed, for abdominal pain/cramping. (Patient not taking: Reported on 11/09/2017) 20 tablet 0 Not Taking at Unknown time  . letrozole (FEMARA) 2.5 MG tablet Take 2 tablets (5 mg total) by mouth daily. (Patient taking differently: Take 2.5 mg by mouth daily. Take 1 tablet daily for 5 days once a month to ovulate.) 10 tablet 0 Past Month at Unknown time  . LORazepam (ATIVAN) 1 MG tablet Take 1 mg by mouth  2 (two) times daily as needed for anxiety (panic).   1 03/03/2018 at 2000  . medroxyPROGESTERone (PROVERA) 10 MG tablet Take 1 tablet (10 mg total) by mouth daily for 10 days. 10 tablet 0   . omeprazole (PRILOSEC) 20 MG capsule Take 1 capsule (20 mg total) by mouth daily. (Patient not taking: Reported on 11/09/2017) 30 capsule 0 Completed Course at Unknown time    Musculoskeletal: Strength & Muscle Tone: within normal limits Gait & Station: normal Patient leans: N/A  Psychiatric Specialty Exam: I review3d physical examination performed in the ER and agree with the findings. Physical Exam  Nursing note and vitals reviewed. Psychiatric: Her affect is blunt. Her speech is delayed. She is slowed and withdrawn. Cognition and memory are impaired. She expresses impulsivity. She exhibits a depressed mood. She expresses suicidal ideation. She expresses suicidal plans.    Review of Systems  Neurological: Negative.   Psychiatric/Behavioral: Positive for depression and suicidal ideas. The patient has insomnia.     Blood pressure 111/79, pulse 77,  temperature 98.2 F (36.8 C), temperature source Oral, resp. rate 16, height 5' (1.524 m), weight 91.2 kg, last menstrual period 02/25/2018, SpO2 100 %.Body mass index is 39.26 kg/m.  See SRA                                                  Sleep:       Treatment Plan Summary: Daily contact with patient to assess and evaluate symptoms and progress in treatment and Medication management    Ms. Arterberry is a 22 year old female with a history of depression and self injudious behavior admitted after episode of cutting in the context of relationship problems.   #Suicidal ideation -patient is able to contract for safety in the hospital  #Mood -continue Wellbutrin 300 mg daily -Trazodone 100 mg nightly  #Anxiety -takes Ativan 1 mg BID from Dr. Kasandra Knudsen  #Asthma -continue inhalers  #GERD -Protonix 40 mg daily  #Disposition -discharge to home -follow up with Dr. [facility] at [facility]   Observation Level/Precautions:  15 minute checks  Laboratory:  CBC Chemistry Profile UDS UA  Psychotherapy:    Medications:    Consultations:    Discharge Concerns:    Estimated LOS:  Other:     Physician Treatment Plan for Primary Diagnosis: Major depressive disorder, recurrent severe without psychotic features ([place]) Long Term Goal(s): Improvement in symptoms so as ready for discharge  Short Term Goals: Ability to identify changes in lifestyle to reduce recurrence of condition will improve, Ability to verbalize feelings will improve, Ability to disclose and discuss suicidal ideas, Ability to demonstrate self-control will improve, Ability to identify and develop effective coping behaviors will improve, Ability to maintain clinical measurements within normal limits will improve and Ability to identify triggers associated with substance abuse/mental health issues will improve  Physician Treatment Plan for Secondary Diagnosis: Principal Problem:   Major depressive disorder, recurrent severe  without psychotic features ([place]) Active Problems:   Self-injurious behavior   Suicide attempt [facility])  Long Term Goal(s): Improvement in symptoms so as ready for discharge  Short Term Goals: NA  I certify that inpatient services furnished can reasonably be expected to improve the patient's condition.    Orson Slick, MD 9/30/201910:29 PM

## 2018-03-04 NOTE — ED Notes (Signed)
Hourly rounding reveals patient sleeping in room. No complaints, stable, in no acute distress. Q15 minute rounds and monitoring via Rover and Officer to continue.  

## 2018-03-04 NOTE — ED Notes (Signed)
Patient has some superficial cuts to right forearm and right wrist. Appears to be healing, no open, or bleeding areas.

## 2018-03-04 NOTE — BHH Suicide Risk Assessment (Signed)
[facility] Admission Suicide Risk Assessment   Nursing information obtained from:  Patient Demographic factors:  Caucasian Current Mental Status:  NA Loss Factors:  NA Historical Factors:  Prior suicide attempts Risk Reduction Factors:  Employed  Total Time spent with patient: 1 hour Principal Problem: <principal problem not specified> Diagnosis:   Patient Active Problem List   Diagnosis Date Noted  . Major depressive disorder, recurrent severe without psychotic features ([facility]) [F33.2] 03/04/2018  . Self-injurious behavior [F48.9] 03/04/2018  . Suicide attempt [facility]) [T14.91XA] 03/04/2018   Subjective Data: suicide attempt  Continued Clinical Symptoms:  Alcohol Use Disorder Identification Test Final Score (AUDIT): 0 The "Alcohol Use Disorders Identification Test", Guidelines for Use in Primary Care, Second Edition.  World Science writer [facility]). Score between 0-7:  no or low risk or alcohol related problems. Score between 8-15:  moderate risk of alcohol related problems. Score between 16-19:  high risk of alcohol related problems. Score 20 or above:  warrants further diagnostic evaluation for alcohol dependence and treatment.   CLINICAL FACTORS:   Depression:   Impulsivity   Musculoskeletal: Strength & Muscle Tone: within normal limits Gait & Station: normal Patient leans: N/A  Psychiatric Specialty Exam: Physical Exam  Nursing note and vitals reviewed. Psychiatric: Her speech is normal. Her affect is blunt. She is slowed and withdrawn. Cognition and memory are normal. She expresses impulsivity. She exhibits a depressed mood. She expresses suicidal ideation. She expresses suicidal plans.    Review of Systems  Neurological: Negative.   Psychiatric/Behavioral: Positive for depression and suicidal ideas. The patient is nervous/anxious and has insomnia.   All other systems reviewed and are negative.   Blood pressure 111/79, pulse 77, temperature 98.2 F (36.8 C), temperature  source Oral, resp. rate 16, height 5' (1.524 m), weight 91.2 kg, last menstrual period 02/25/2018, SpO2 100 %.Body mass index is 39.26 kg/m.  General Appearance: Casual  Eye Contact:  Minimal  Speech:  Slow  Volume:  Decreased  Mood:  Depressed and Hopeless  Affect:  Blunt  Thought Process:  Goal Directed and Descriptions of Associations: Intact  Orientation:  Full (Time, Place, and Person)  Thought Content:  WDL  Suicidal Thoughts:  Yes.  with intent/plan  Homicidal Thoughts:  No  Memory:  Immediate;   Fair Recent;   Fair Remote;   Fair  Judgement:  Impaired  Insight:  Shallow  Psychomotor Activity:  Psychomotor Retardation  Concentration:  Concentration: Fair and Attention Span: Fair  Recall:  [employer] of Knowledge:  Fair  Language:  Fair  Akathisia:  No  Handed:  Right  AIMS (if indicated):     Assets:  Communication Skills Desire for Improvement Financial Resources/Insurance Housing Physical Health Resilience Social Support Transportation Vocational/Educational  ADL's:  Intact  Cognition:  WNL  Sleep:         COGNITIVE FEATURES THAT CONTRIBUTE TO RISK:  None    SUICIDE RISK:   Moderate:  Frequent suicidal ideation with limited intensity, and duration, some specificity in terms of plans, no associated intent, good self-control, limited dysphoria/symptomatology, some risk factors present, and identifiable protective factors, including available and accessible social support.  PLAN OF CARE: hospital admission, medication management, discharge planning.  Brenda Hayes is a 22 year old female with a history of depression and self injudious behavior admitted after episode of cutting in the context of relationship problems.   #Suicidal ideation -patient is able to contract for safety in the hospital  #Mood -continue Wellbutrin 300 mg daily -Trazodone 100 mg  nightly  #Anxiety -takes Ativan 1 mg BID from Dr. Janeece Riggers  #Asthma -continue inhalers  #GERD -Protonix  40 mg daily  #Disposition -discharge to home -follow up with Dr. Janeece Riggers at [facility]  I certify that inpatient services furnished can reasonably be expected to improve the patient's condition.   Kristine Linea, MD 03/04/2018, 10:20 PM

## 2018-03-04 NOTE — Consult Note (Signed)
Will admit to psychiatry. Full note to follow. Orders entered.  

## 2018-03-04 NOTE — ED Notes (Signed)
Patient continues to lay in bed in the dark, she refused breakfast and lunch. Informed patient she has to try and eat. Patient still refused, patient lunch tray in the refrigerator. Patient is alert and oriented, in no acute distress. Patient denies SI, HI, and AVH.

## 2018-03-04 NOTE — Plan of Care (Signed)
Patient instructed on unit programing  folder given , verbalize understanding  Problem: Education: Goal: Knowledge of [place] [facility] Education information/materials will improve Outcome: Progressing

## 2018-03-04 NOTE — BH Assessment (Signed)
Pending bed assignment

## 2018-03-04 NOTE — ED Notes (Signed)
BEHAVIORAL HEALTH ROUNDING Patient sleeping: No. Patient alert and oriented: yes Behavior appropriate: Yes.  ; If no, describe:  Nutrition and fluids offered: yes Toileting and hygiene offered: Yes  Sitter present: q15 minute observations and security monitoring Law enforcement present: Yes    

## 2018-03-04 NOTE — ED Notes (Signed)
Patient refused breakfast 

## 2018-03-04 NOTE — ED Notes (Signed)
Patient used telephone to call her job

## 2018-03-04 NOTE — ED Notes (Signed)
Pt. Transferred from Triage to room 23 after dressing out and screening for contraband. Pt. Oriented to [place] including Q15 minute rounds as well as Rover and Officer for their protection. Patient is alert and oriented, warm and dry in no acute distress. Patient denies SI, HI, and AVH. Pt. Encouraged to let me know if needs arise.  

## 2018-03-04 NOTE — Progress Notes (Signed)
Admission Note: From [facility] RN ER  D: Pt appeared depressed  With  a flat affect.  Pt  denies SI / AVH at this time Patient was living  with  Husband  Found out he was cheating on her and he put her out of the apartment . Patient had been trying to get pregnant .  Patient found boyfriend  Whom also cheating on her . Patient is a cutter and  Had cut her left arm  From wrist to elbow  Superficial   Patient is IVC [place]  Police brought to unit . Patient works at [employer] is redirectable and cooperative with assessment.      A: Pt admitted to unit per protocol, skin assessment and search done and no contraband found with [employer] .  Pt  educated on therapeutic milieu rules. Pt was introduced to milieu by nursing staff.    R: Pt was receptive to education about the milieu .  15 min safety checks started. Clinical research associate offered support

## 2018-03-05 DIAGNOSIS — F429 Obsessive-compulsive disorder, unspecified: Secondary | ICD-10-CM | POA: Diagnosis present

## 2018-03-05 DIAGNOSIS — F4310 Post-traumatic stress disorder, unspecified: Secondary | ICD-10-CM | POA: Diagnosis present

## 2018-03-05 LAB — LIPID PANEL
CHOLESTEROL: 143 mg/dL (ref 0–200)
HDL: 53 mg/dL (ref >40)
LDL CALC: 82 mg/dL (ref 0–99)
TRIGLYCERIDES: 40 mg/dL (ref <150)
Total CHOL/HDL Ratio: 2.7 RATIO
VLDL: 8 mg/dL (ref 0–40)

## 2018-03-05 LAB — TSH: TSH: 1.579 u[IU]/mL (ref 0.350–4.500)

## 2018-03-05 MED ORDER — FLUVOXAMINE MALEATE 50 MG PO TABS
50.0000 mg | ORAL_TABLET | Freq: Every day | ORAL | Status: DC
  Administered 2018-03-05: 50 mg via ORAL
  Filled 2018-03-05: qty 1

## 2018-03-05 MED ORDER — ZOLPIDEM TARTRATE 5 MG PO TABS
5.0000 mg | ORAL_TABLET | Freq: Every evening | ORAL | Status: DC | PRN
  Administered 2018-03-05 – 2018-03-06 (×2): 5 mg via ORAL
  Filled 2018-03-05 (×2): qty 1

## 2018-03-05 NOTE — Progress Notes (Signed)
Recreation Therapy Notes   Date: 03/05/2018  Time: 9:30 am  Location: Craft Room  Behavioral response: Appropriate   Intervention Topic: Coping Skills  Discussion/Intervention:  Group content on today was focused on coping skills. The group defined what coping skills are and when they can be used. Individuals described how they normally cope with thing and the coping skills they normally use. Patients expressed why it is important to cope with things and how not coping with things can affect you. The group participated in the intervention "My coping box" and made coping boxes while adding coping skills they could use in the future to the box. Clinical Observations/Feedback:  Patient came to group late due to unknown reasons.Individual was social with peers and staff while participating in the intervention.  Epic [place] LRT/CTRS          Sidhant Helderman 03/05/2018 11:37 AM
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
## 2018-03-05 NOTE — Progress Notes (Addendum)
[facility] MD Progress Note  03/05/2018 6:20 PM Tameeka Luo  MRN:  [id number]  Subjective:    Ms. Quirarte feels better today. She is not overtly suicidal and is able to contract for safety in the hospital but does experience urges to cut. She already projects into the future worried about her job and FMLA forms. Sleep was interrupted with nightmares of he mother's murder.    Principal Problem: Major depressive disorder, recurrent severe without psychotic features (HCC) Diagnosis:   Patient Active Problem List   Diagnosis Date Noted  . Major depressive disorder, recurrent severe without psychotic features (HCC) [F33.2] 03/04/2018    Priority: High  . OCD (obsessive compulsive disorder) [F42.9] 03/05/2018  . PTSD (post-traumatic stress disorder) [F43.10] 03/05/2018  . Self-injurious behavior [Z72.89] 03/04/2018  . Suicide attempt [facility]) [T14.91XA] 03/04/2018   Total Time spent with patient: 20 minutes  Past Psychiatric History: depression, anxiety, self injurious behavior  Past Medical History:  Past Medical History:  Diagnosis Date  . Anxiety   . Depression     Past Surgical History:  Procedure Laterality Date  . WISDOM TOOTH EXTRACTION     x4 extracted    Family History:  Family History  Problem Relation Age of Onset  . Breast cancer Paternal Aunt   . Breast cancer Maternal Grandmother    Family Psychiatric  History: depression Social History:  Social History   Substance and Sexual Activity  Alcohol Use No     Social History   Substance and Sexual Activity  Drug Use No    Social History   Socioeconomic History  . Marital status: Legally Separated    Spouse name: Not on file  . Number of children: Not on file  . Years of education: Not on file  . Highest education level: Not on file  Occupational History  . Not on file  Social Needs  . Financial resource strain: Not on file  . Food insecurity:    Worry: Not on file    Inability: Not on file  .  Transportation needs:    Medical: Not on file    Non-medical: Not on file  Tobacco Use  . Smoking status: Never Smoker  . Smokeless tobacco: Never Used  Substance and Sexual Activity  . Alcohol use: No  . Drug use: No  . Sexual activity: Yes    Birth control/protection: None  Lifestyle  . Physical activity:    Days per week: Not on file    Minutes per session: Not on file  . Stress: Not on file  Relationships  . Social connections:    Talks on phone: Not on file    Gets together: Not on file    Attends religious service: Not on file    Active member of club or organization: Not on file    Attends meetings of clubs or organizations: Not on file    Relationship status: Not on file  Other Topics Concern  . Not on file  Social History Narrative  . Not on file   Additional Social History:                         Sleep: Poor  Appetite:  Fair  Current Medications: Current Facility-Administered Medications  Medication Dose Route Frequency Provider Last Rate Last Dose  . acetaminophen (TYLENOL) tablet 650 mg  650 mg Oral Q6H PRN Delanee Xin B, MD      . albuterol (PROVENTIL HFA;VENTOLIN HFA) 108 (90  Base) MCG/ACT inhaler 1-2 puff  1-2 puff Inhalation Q6H PRN Yifan Auker B, MD      . alum & mag hydroxide-simeth (MAALOX/MYLANTA) 200-200-20 MG/5ML suspension 30 mL  30 mL Oral Q4H PRN Hurshell Dino B, MD      . buPROPion (WELLBUTRIN XL) 24 hr tablet 300 mg  300 mg Oral Daily Jourdan Maldonado B, MD   300 mg at 03/05/18 0828  . fluvoxaMINE (LUVOX) tablet 50 mg  50 mg Oral QHS Puneet Selden B, MD      . hydrOXYzine (ATARAX/VISTARIL) tablet 50 mg  50 mg Oral TID PRN Kaidin Boehle B, MD   50 mg at 03/04/18 2128  . magnesium hydroxide (MILK OF MAGNESIA) suspension 30 mL  30 mL Oral Daily PRN Lane Kjos B, MD      . zolpidem (AMBIEN) tablet 5 mg  5 mg Oral QHS PRN Milta Croson B, MD        Lab Results:  Results for orders  placed or performed during the hospital encounter of 03/04/18 (from the past 48 hour(s))  Lipid panel     Status: None   Collection Time: 03/04/18  1:26 AM  Result Value Ref Range   Cholesterol 143 0 - 200 mg/dL   Triglycerides 40 <161 mg/dL   HDL 53 >09 mg/dL   Total CHOL/HDL Ratio 2.7 RATIO   VLDL 8 0 - 40 mg/dL   LDL Cholesterol 82 0 - 99 mg/dL    Comment:        Total Cholesterol/HDL:CHD Risk Coronary Heart Disease Risk Table                     Men   Women  1/2 Average Risk   3.4   3.3  Average Risk       5.0   4.4  2 X Average Risk   9.6   7.1  3 X Average Risk  23.4   11.0        Use the calculated Patient Ratio above and the CHD Risk Table to determine the patient's CHD Risk.        ATP III CLASSIFICATION (LDL):  <100     mg/dL   Optimal  604-540  mg/dL   Near or Above                    Optimal  130-159  mg/dL   Borderline  981-191  mg/dL   High  >478     mg/dL   Very High Performed at [facility], [address] [place], [place] [address]   TSH     Status: None   Collection Time: 03/04/18  1:26 AM  Result Value Ref Range   TSH 1.579 0.350 - 4.500 uIU/mL    Comment: Performed by a 3rd Generation assay with a functional sensitivity of <=0.01 uIU/mL. Performed at [facility], [address] [place], [place] [address]     Blood Alcohol level:  Lab Results  Component Value Date   [facility] <10 03/04/2018    Metabolic Disorder Labs: No results found for: HGBA1C, MPG Lab Results  Component Value Date   PROLACTIN 12.8 11/09/2017   Lab Results  Component Value Date   CHOL 143 03/04/2018   TRIG 40 03/04/2018   HDL 53 03/04/2018   CHOLHDL 2.7 03/04/2018   VLDL 8 03/04/2018   LDLCALC 82 03/04/2018    Physical Findings: AIMS:  , ,  ,  ,  CIWA:    COWS:     Musculoskeletal: Strength & Muscle Tone: within normal limits Gait & Station: normal Patient leans: N/A  Psychiatric Specialty Exam: Physical Exam  Nursing note and vitals  reviewed. Psychiatric: Her speech is normal and behavior is normal. Thought content normal. Her mood appears anxious. Cognition and memory are normal. She expresses impulsivity.    Review of Systems  Neurological: Negative.   Psychiatric/Behavioral: The patient is nervous/anxious and has insomnia.   All other systems reviewed and are negative.   Blood pressure 104/65, pulse (!) 105, temperature 98.4 F (36.9 C), temperature source Oral, resp. rate 18, height 5' (1.524 m), weight 91.2 kg, last menstrual period 02/25/2018, SpO2 99 %.Body mass index is 39.26 kg/m.  General Appearance: Casual  Eye Contact:  Good  Speech:  Clear and Coherent  Volume:  Normal  Mood:  Anxious and Depressed  Affect:  Flat  Thought Process:  Goal Directed and Descriptions of Associations: Intact  Orientation:  Full (Time, Place, and Person)  Thought Content:  WDL  Suicidal Thoughts:  Yes.  without intent/plan  Homicidal Thoughts:  No  Memory:  Immediate;   Fair Recent;   Fair Remote;   Fair  Judgement:  Impaired  Insight:  Shallow  Psychomotor Activity:  Normal  Concentration:  Concentration: Fair and Attention Span: Fair  Recall:  [employer] of Knowledge:  Fair  Language:  Fair  Akathisia:  No  Handed:  Right  AIMS (if indicated):     Assets:  Communication Skills Desire for Improvement Financial Resources/Insurance Housing Physical Health Resilience Social Support Talents/Skills Transportation Vocational/Educational  ADL's:  Intact  Cognition:  WNL  Sleep:  Number of Hours: 8     Treatment Plan Summary: Daily contact with patient to assess and evaluate symptoms and progress in treatment and Medication management   Ms. Weiher is a 22 year old female with a history of depression and self injudious behavior admitted after episode of cutting in the context of relationship problems.   #Suicidal ideation -patient is able to contract for safety in the hospital  #Mood -continue  Wellbutrin 300 mg daily -discontinue Trazodone -consider mood stabilizer  #Anxiety -stop Ativan 1 mg BID from Dr. Janeece Riggers -start Luvox 50 mg nightly -consider Minipress  #Asthma -continue inhalers  #GERD -Protonix 40 mg daily  #Disposition -discharge to home -follow up with Dr. Janeece Riggers at [facility]  Kristine Linea, MD 03/05/2018, 6:20 PM

## 2018-03-05 NOTE — Progress Notes (Signed)
D: Pt denies SI/HI/AVH, able to contract for safety. Pt is pleasant and cooperative, but forwards little and is a bit guarded. Pt. Complains of anxiety and requests PRN for sleep and anxiety.  Patient Interaction minimal. Pt. Isolative and withdrawn to room, goes to bed early. Pt. Reports depression and anxiety 8/10 and 9/10 respectively. Pt. Affect sad and flat. Pt. Verbalizes she will come to staff if she gets the urge to cut herself. Pt. Reports she cuts when, "I get severely anxious".   A: Q x 15 minute observation checks were completed for safety. Patient was provided with education.  Patient was given/offered medications per orders. Patient  was encourage to attend groups, participate in unit activities and continue with plan of care. Pt. Chart and plans of care reviewed. Pt. Given support and encouragement.   R: Patient is complaint with medication and unit procedures with direction and encouragement fromt staff.             Precautionary checks every 15 minutes for safety maintained, room free of safety hazards, patient sustains no injury or falls during this shift. Will endorse care to next shift.

## 2018-03-05 NOTE — Plan of Care (Signed)
Patient aware of information  received and verbalize understanding .  Working on Pharmacologist  .  Improvement with mental and emotional status . Voice of no safety concerns  Denies suicidal ideations .   Problem: Coping: Goal: Coping ability will improve Outcome: Progressing Goal: Will verbalize feelings Outcome: Progressing   Problem: Education: Goal: Knowledge of [facility] Education information/materials will improve Outcome: Progressing Goal: Emotional status will improve Outcome: Progressing Goal: Mental status will improve Outcome: Progressing Goal: Verbalization of understanding the information provided will improve Outcome: Progressing   Problem: Safety: Goal: Ability to disclose and discuss suicidal ideas will improve Outcome: Progressing Goal: Ability to identify and utilize support systems that promote safety will improve Outcome: Progressing

## 2018-03-05 NOTE — Plan of Care (Signed)
Pt. Able to verbalize education provided. Pt. Denies SI/Hi, will contract for safety. Pt. Coping is poor, requires PRN medications for comfort. Pt. Reports depression and anxiety 8/10 and 9/10 respectively. Pt. Affect sad and flat. Pt. Visible in the milieu for a period of time, but eventually isolative and withdrawn to room.    Problem: Coping: Goal: Coping ability will improve Outcome: Not Progressing Goal: Will verbalize feelings Outcome: Not Progressing   Problem: Education: Goal: Emotional status will improve Outcome: Not Progressing Goal: Mental status will improve Outcome: Not Progressing   Problem: Education: Goal: Knowledge of [facility] Education information/materials will improve Outcome: Progressing   Problem: Safety: Goal: Ability to disclose and discuss suicidal ideas will improve Outcome: Progressing

## 2018-03-05 NOTE — BHH Suicide Risk Assessment (Signed)
[facility] INPATIENT:  Family/Significant Other Suicide Prevention Education  Suicide Prevention Education:  Patient Refusal for Family/Significant Other Suicide Prevention Education: The patient Brenda Hayes has refused to provide written consent for family/significant other to be provided Family/Significant Other Suicide Prevention Education during admission and/or prior to discharge.  Physician notified.  Johny Shears 03/05/2018, 4:27 PM

## 2018-03-05 NOTE — Progress Notes (Signed)
D: Patient stated slept poor  last night .Stated appetite poor and energy level  Is normal. Stated concentration is good . Stated on Depression scale  8, hopeless 8 and anxiety 10 .( low 0-10 high) Denies suicidal  homicidal ideations  .  No auditory hallucinations  No pain concerns . Appropriate ADL'S. Interacting with peers and staff. Patient aware of information  received and verbalize understanding .  Working on Pharmacologist  .  Improvement with mental and emotional status . Voice of no safety concerns  Denies suicidal ideations .   A: Encourage patient participation with unit programming . Instruction  Given on  Medication , verbalize understanding.  R: Voice no other concerns. Staff continue to monitor

## 2018-03-06 ENCOUNTER — Other Ambulatory Visit: Payer: Managed Care, Other (non HMO)

## 2018-03-06 LAB — HEMOGLOBIN A1C
Hgb A1c MFr Bld: 5 % (ref 4.8–5.6)
Mean Plasma Glucose: 97 mg/dL

## 2018-03-06 MED ORDER — HYDROXYZINE HCL 50 MG PO TABS: 50.0000 mg | ORAL_TABLET | Freq: Three times a day (TID) | ORAL | 0 refills | Status: DC | PRN

## 2018-03-06 MED ORDER — FLUVOXAMINE MALEATE 100 MG PO TABS: 100.0000 mg | ORAL_TABLET | Freq: Every day | ORAL | 1 refills | Status: DC

## 2018-03-06 MED ORDER — ZOLPIDEM TARTRATE 5 MG PO TABS: 5.0000 mg | ORAL_TABLET | Freq: Every evening | ORAL | 0 refills | Status: DC | PRN

## 2018-03-06 MED ORDER — PRAZOSIN HCL 2 MG PO CAPS
2.0000 mg | ORAL_CAPSULE | Freq: Two times a day (BID) | ORAL | Status: DC
  Administered 2018-03-06 – 2018-03-07 (×3): 2 mg via ORAL
  Filled 2018-03-06 (×3): qty 1

## 2018-03-06 MED ORDER — FLUVOXAMINE MALEATE 50 MG PO TABS
100.0000 mg | ORAL_TABLET | Freq: Every day | ORAL | Status: DC
  Administered 2018-03-06: 100 mg via ORAL
  Filled 2018-03-06: qty 2

## 2018-03-06 MED ORDER — PRAZOSIN HCL 2 MG PO CAPS
2.0000 mg | ORAL_CAPSULE | Freq: Once | ORAL | Status: AC
  Administered 2018-03-06: 2 mg via ORAL
  Filled 2018-03-06: qty 1

## 2018-03-06 MED ORDER — PRAZOSIN HCL 2 MG PO CAPS: 2.0000 mg | ORAL_CAPSULE | Freq: Two times a day (BID) | ORAL | 1 refills | Status: DC

## 2018-03-06 NOTE — Progress Notes (Signed)
   03/06/18 1030  Clinical Encounter Type  Visited With Patient  Visit Type Initial;Spiritual support;Behavioral Health  Referral From Social work  Consult/Referral To Chaplain  Spiritual Encounters  Spiritual Needs Emotional;Other (Comment)   CH attended the patient's treatment team meeting. The patient will be discharging to her parent's home. Ms. Rohman stated that her goal for this hospitalization was to learn better coping skills.

## 2018-03-06 NOTE — Telephone Encounter (Signed)
Can we print and fax the requisition

## 2018-03-06 NOTE — BHH Group Notes (Signed)

## 2018-03-06 NOTE — Progress Notes (Signed)
Recreation Therapy Notes  Date: 03/06/18  Time: 9:30 am   Location: Craft room   Behavioral response: N/A   Intervention Topic: Self-care   Discussion/Intervention: Patient did not attend group.   Clinical Observations/Feedback:  Patient did not attend group.        Brenda Hayes 03/06/2018 9:54 AM 

## 2018-03-06 NOTE — Progress Notes (Signed)
Recreation Therapy Notes  INPATIENT RECREATION THERAPY ASSESSMENT  Patient Details Name: Brenda Hayes MRN: [id number] DOB: 24-Nov-1995 Today's Date: 03/06/2018       Information Obtained From: Patient  Able to Participate in Assessment/Interview: Yes  Patient Presentation: Responsive  Reason for Admission (Per Patient): Suicide Attempt, Self-injurious Behavior  Patient Stressors: Relationship  Coping Skills:   Art, Self-Injury  Leisure Interests (2+):  Music - Listen  Frequency of Recreation/Participation: Weekly  Awareness of Community Resources:  Yes  Community Resources:  [employer]  Current Use:    If no, Barriers?:    Expressed Interest in [employer] Information:    [place] of Residence:  [place]  Patient Main Form of Transportation: Set designer  Patient Strengths:  Caring, forgiving, friendly  Patient Identified Areas of Improvement:  Have better temper control and ove myself more  Patient Goal for Hospitalization:  Learn better coping skills  Current SI (including self-harm):  No  Current HI:  No  Current AVH: No  Staff Intervention Plan: Group Attendance, Collaborate with Interdisciplinary Treatment Team  Consent to Intern Participation: N/A  Tyquan Carmickle 03/06/2018, 2:20 PM

## 2018-03-06 NOTE — Tx Team (Addendum)
Interdisciplinary Treatment and Diagnostic Plan Update  03/06/2018 Time of Session: 10:30am Memori Sammon MRN: [id number]  Principal Diagnosis: Major depressive disorder, recurrent severe without psychotic features (HCC)  Secondary Diagnoses: Principal Problem:   Major depressive disorder, recurrent severe without psychotic features (HCC) Active Problems:   Self-injurious behavior   Suicide attempt (HCC)   OCD (obsessive compulsive disorder)   PTSD (post-traumatic stress disorder)   Current Medications:  Current Facility-Administered Medications  Medication Dose Route Frequency Provider Last Rate Last Dose  . acetaminophen (TYLENOL) tablet 650 mg  650 mg Oral Q6H PRN Pucilowska, Jolanta B, MD   650 mg at 03/06/18 0845  . albuterol (PROVENTIL HFA;VENTOLIN HFA) 108 (90 Base) MCG/ACT inhaler 1-2 puff  1-2 puff Inhalation Q6H PRN Pucilowska, Jolanta B, MD      . alum & mag hydroxide-simeth (MAALOX/MYLANTA) 200-200-20 MG/5ML suspension 30 mL  30 mL Oral Q4H PRN Pucilowska, Jolanta B, MD      . buPROPion (WELLBUTRIN XL) 24 hr tablet 300 mg  300 mg Oral Daily Pucilowska, Jolanta B, MD   300 mg at 03/06/18 0845  . fluvoxaMINE (LUVOX) tablet 50 mg  50 mg Oral QHS Pucilowska, Jolanta B, MD   50 mg at 03/05/18 2104  . hydrOXYzine (ATARAX/VISTARIL) tablet 50 mg  50 mg Oral TID PRN Pucilowska, Jolanta B, MD   50 mg at 03/05/18 2104  . magnesium hydroxide (MILK OF MAGNESIA) suspension 30 mL  30 mL Oral Daily PRN Pucilowska, Jolanta B, MD      . zolpidem (AMBIEN) tablet 5 mg  5 mg Oral QHS PRN Pucilowska, Jolanta B, MD   5 mg at 03/05/18 2104   PTA Medications: Medications Prior to Admission  Medication Sig Dispense Refill Last Dose  . albuterol (PROVENTIL HFA;VENTOLIN HFA) 108 (90 Base) MCG/ACT inhaler Inhale 1-2 puffs into the lungs every 6 (six) hours as needed for wheezing or shortness of breath.   PRN at PRN  . albuterol (PROVENTIL) (2.5 MG/3ML) 0.083% nebulizer solution Take 2.5 mg by  nebulization every 6 (six) hours as needed for wheezing or shortness of breath.   PRN at PRN  . buPROPion (WELLBUTRIN XL) 300 MG 24 hr tablet Take 300 mg by mouth daily.  1 03/03/2018 at 0800  . busPIRone (BUSPAR) 10 MG tablet Take 10 mg by mouth 2 (two) times daily.  3 03/03/2018 at 2000  . dicyclomine (BENTYL) 20 MG tablet Take 1 tablet (20 mg total) by mouth 2 (two) times daily. Take as prescribed, as needed, for abdominal pain/cramping. (Patient not taking: Reported on 11/09/2017) 20 tablet 0 Not Taking at Unknown time  . letrozole (FEMARA) 2.5 MG tablet Take 2 tablets (5 mg total) by mouth daily. (Patient taking differently: Take 2.5 mg by mouth daily. Take 1 tablet daily for 5 days once a month to ovulate.) 10 tablet 0 Past Month at Unknown time  . LORazepam (ATIVAN) 1 MG tablet Take 1 mg by mouth 2 (two) times daily as needed for anxiety (panic).   1 03/03/2018 at 2000  . medroxyPROGESTERone (PROVERA) 10 MG tablet Take 1 tablet (10 mg total) by mouth daily for 10 days. 10 tablet 0   . omeprazole (PRILOSEC) 20 MG capsule Take 1 capsule (20 mg total) by mouth daily. (Patient not taking: Reported on 11/09/2017) 30 capsule 0 Completed Course at Unknown time    Patient Stressors: Loss of relationship Marital or family conflict  Patient Strengths: Average or above average intelligence General fund of knowledge  Treatment Modalities: Medication  Management, Group therapy, Case management,  1 to 1 session with clinician, Psychoeducation, Recreational therapy.   Physician Treatment Plan for Primary Diagnosis: Major depressive disorder, recurrent severe without psychotic features (HCC) Long Term Goal(s): Improvement in symptoms so as ready for discharge Improvement in symptoms so as ready for discharge   Short Term Goals: Ability to identify changes in lifestyle to reduce recurrence of condition will improve Ability to verbalize feelings will improve Ability to disclose and discuss suicidal  ideas Ability to demonstrate self-control will improve Ability to identify and develop effective coping behaviors will improve Ability to maintain clinical measurements within normal limits will improve Ability to identify triggers associated with substance abuse/mental health issues will improve NA  Medication Management: Evaluate patient's response, side effects, and tolerance of medication regimen.  Therapeutic Interventions: 1 to 1 sessions, Unit Group sessions and Medication administration.  Evaluation of Outcomes: Progressing  Physician Treatment Plan for Secondary Diagnosis: Principal Problem:   Major depressive disorder, recurrent severe without psychotic features (HCC) Active Problems:   Self-injurious behavior   Suicide attempt (HCC)   OCD (obsessive compulsive disorder)   PTSD (post-traumatic stress disorder)  Long Term Goal(s): Improvement in symptoms so as ready for discharge Improvement in symptoms so as ready for discharge   Short Term Goals: Ability to identify changes in lifestyle to reduce recurrence of condition will improve Ability to verbalize feelings will improve Ability to disclose and discuss suicidal ideas Ability to demonstrate self-control will improve Ability to identify and develop effective coping behaviors will improve Ability to maintain clinical measurements within normal limits will improve Ability to identify triggers associated with substance abuse/mental health issues will improve NA     Medication Management: Evaluate patient's response, side effects, and tolerance of medication regimen.  Therapeutic Interventions: 1 to 1 sessions, Unit Group sessions and Medication administration.  Evaluation of Outcomes: Progressing   RN Treatment Plan for Primary Diagnosis: Major depressive disorder, recurrent severe without psychotic features (HCC) Long Term Goal(s): Knowledge of disease and therapeutic regimen to maintain health will  improve  Short Term Goals: Ability to remain free from injury will improve, Ability to demonstrate self-control, Ability to identify and develop effective coping behaviors will improve and Compliance with prescribed medications will improve  Medication Management: RN will administer medications as ordered by provider, will assess and evaluate patient's response and provide education to patient for prescribed medication. RN will report any adverse and/or side effects to prescribing provider.  Therapeutic Interventions: 1 on 1 counseling sessions, Psychoeducation, Medication administration, Evaluate responses to treatment, Monitor vital signs and CBGs as ordered, Perform/monitor CIWA, COWS, AIMS and Fall Risk screenings as ordered, Perform wound care treatments as ordered.  Evaluation of Outcomes: Progressing   LCSW Treatment Plan for Primary Diagnosis: Major depressive disorder, recurrent severe without psychotic features (HCC) Long Term Goal(s): Safe transition to appropriate next level of care at discharge, Engage patient in therapeutic group addressing interpersonal concerns.  Short Term Goals: Engage patient in aftercare planning with referrals and resources, Increase social support, Increase ability to appropriately verbalize feelings, Identify triggers associated with mental health/substance abuse issues and Increase skills for wellness and recovery  Therapeutic Interventions: Assess for all discharge needs, 1 to 1 time with Social worker, Explore available resources and support systems, Assess for adequacy in community support network, Educate family and significant other(s) on suicide prevention, Complete Psychosocial Assessment, Interpersonal group therapy.  Evaluation of Outcomes: Progressing   Progress in Treatment: Attending groups: No. Participating in groups: No. Taking medication  as prescribed: Yes. Toleration medication: Yes. Family/Significant other contact made: No, will  contact:  Patient refused Patient understands diagnosis: Yes. Discussing patient identified problems/goals with staff: Yes. Medical problems stabilized or resolved: Yes. Denies suicidal/homicidal ideation: Yes. Issues/concerns per patient self-inventory: No. Other:   New problem(s) identified: No, Describe:  None  New Short Term/Long Term Goal(s): "To use better coping skills so that I wont cut again."  Patient Goals:  "To use better coping skills so that I wont cut again."  Discharge Plan or Barriers: To discharge home and follow up with outpatient providers.  Reason for Continuation of Hospitalization: Medication stabilization  Estimated Length of Stay:  1 day  Recreational Therapy: Patient Stressors: Relationship  Patient Goal: Patient will identify 3 ways to improve self-esteem within 5 recreation therapy group sessions  Attendees: Patient: Brenda Hayes 03/06/2018 10:44 AM  Physician: Dr. Jennet Maduro, MD 03/06/2018 10:44 AM  Nursing: Bruce Donath, RN 03/06/2018 10:44 AM  RN Care Manager: 03/06/2018 10:44 AM  Social Worker: Johny Shears, LCSWA 03/06/2018 10:44 AM  Recreational Therapist: Danella Deis. Dreama Saa, LRT 03/06/2018 10:44 AM  Other: Unk Pinto, RHA 03/06/2018 10:44 AM  Other: Damian Leavell, Cha[place]in 03/06/2018 10:44 AM  Other: 03/06/2018 10:44 AM    Scribe for Treatment Team: Johny Shears, LCSW 03/06/2018 10:44 AM

## 2018-03-06 NOTE — Plan of Care (Signed)
Patient has the ability to cope, however, she has been isolative to her room throughout the day, and has not eaten any meals today. Patient verbalizes understanding of the general information that's been provided to her and has not voiced any further questions or concerns at this time. Patient denies SI/HI/AVH, but rates her depression a "7/10" and her anxiety a "10/10". Patient stated to this writer that "my life's just depressing and not knowing, situations, and I stress myself out" is why she's feeling this way. Patient has been free from injury thus far and remains safe on the unit at this time.  Problem: Coping: Goal: Coping ability will improve Outcome: Progressing Goal: Will verbalize feelings Outcome: Progressing   Problem: Education: Goal: Knowledge of [facility] Education information/materials will improve Outcome: Progressing Goal: Emotional status will improve Outcome: Progressing Goal: Mental status will improve Outcome: Progressing Goal: Verbalization of understanding the information provided will improve Outcome: Progressing   Problem: Safety: Goal: Ability to disclose and discuss suicidal ideas will improve Outcome: Progressing Goal: Ability to identify and utilize support systems that promote safety will improve Outcome: Progressing

## 2018-03-06 NOTE — Plan of Care (Addendum)
Pt. Able to express how she is feeling this evening. Pt. Affect frequently depressed and flat, but smiles occasionally and is pleasant. Pt. Verbalizes understanding of provided education. Pt. Denies SI/HI, and verbally is able to contract for safety. Pt. Not coping, requires PRN medications to reduce anxieties. Pt. Continues to report depression and anxiety high.      Problem: Coping: Goal: Coping ability will improve Outcome: Not Progressing   Problem: Education: Goal: Emotional status will improve Outcome: Not Progressing Goal: Mental status will improve Outcome: Not Progressing   Problem: Coping: Goal: Will verbalize feelings Outcome: Progressing   Problem: Education: Goal: Knowledge of [facility] Education information/materials will improve Outcome: Progressing   Problem: Safety: Goal: Ability to disclose and discuss suicidal ideas will improve Outcome: Progressing

## 2018-03-06 NOTE — [facility] Suicide Risk Assessment (Signed)
[facility] Discharge Suicide Risk Assessment   Principal Problem: Major depressive disorder, recurrent severe without psychotic features [facility]) Discharge Diagnoses:  Patient Active Problem List   Diagnosis Date Noted  . Major depressive disorder, recurrent severe without psychotic features (HCC) [F33.2] 03/04/2018    Priority: High  . OCD (obsessive compulsive disorder) [F42.9] 03/05/2018  . PTSD (post-traumatic stress disorder) [F43.10] 03/05/2018  . Self-injurious behavior [Z72.89] 03/04/2018  . Suicide attempt [facility]) [T14.91XA] 03/04/2018    Total Time spent with patient: 20 minutes  Musculoskeletal: Strength & Muscle Tone: within normal limits Gait & Station: normal Patient leans: N/A  Psychiatric Specialty Exam: Review of Systems  Neurological: Negative.   Psychiatric/Behavioral: Negative.   All other systems reviewed and are negative.   Blood pressure 123/68, pulse 83, temperature 98 F (36.7 C), temperature source Oral, resp. rate 18, height 5' (1.524 m), weight 91.2 kg, last menstrual period 02/25/2018, SpO2 100 %.Body mass index is 39.26 kg/m.  General Appearance: Casual  Eye Contact::  Good  Speech:  Clear and Coherent409  Volume:  Normal  Mood:  Euthymic  Affect:  Appropriate  Thought Process:  Goal Directed and Descriptions of Associations: Intact  Orientation:  Full (Time, Place, and Person)  Thought Content:  WDL  Suicidal Thoughts:  No  Homicidal Thoughts:  No  Memory:  Immediate;   Fair Recent;   Fair Remote;   Fair  Judgement:  Impaired  Insight:  Shallow  Psychomotor Activity:  Normal  Concentration:  Fair  Recall:  [employer] of Knowledge:Fair  Language: Fair  Akathisia:  No  Handed:  Right  AIMS (if indicated):     Assets:  Communication Skills Desire for Improvement Financial Resources/Insurance Housing Physical Health Resilience Social Support Transportation Vocational/Educational  Sleep:  Number of Hours: 7.5  Cognition: WNL  ADL's:   Intact   Mental Status Per Nursing Assessment::   On Admission:  NA  Demographic Factors:  Adolescent or young adult, Divorced or widowed, Caucasian and Living alone  Loss Factors: Loss of significant relationship  Historical Factors: Prior suicide attempts, Family history of mental illness or substance abuse and Impulsivity  Risk Reduction Factors:   Sense of responsibility to family, Employed, Positive social support and Positive therapeutic relationship  Continued Clinical Symptoms:  Depression:   Impulsivity Obsessive-Compulsive Disorder  Cognitive Features That Contribute To Risk:  None    Suicide Risk:  Minimal: No identifiable suicidal ideation.  Patients presenting with no risk factors but with morbid ruminations; may be classified as minimal risk based on the severity of the depressive symptoms  Follow-up Information    [facility] Go on 03/25/2018.   Why:  Please follow up on Monday March 25, 2018 at [facility]. Thank you. Contact information: [address] [place] [address] [phone]        [facility] Follow up.   Why:  Here is an agency that accepts your insurance for mental health therapy. Contact information: Address: [address] [place], [place] [address] Hours: Monday 9AM-5PM Tuesday 9AM-6PM Wednesday 9AM-6PM  Thursday 9AM-6PM Friday 9AM-5PM Saturday Closed Sunday Closed  Phone: [phone]          Plan Of Care/Follow-up recommendations:  Activity:  as tolerated Diet:  regular Other:  keep follow up appointments  Kristine Linea, MD 03/07/2018, 10:26 AM

## 2018-03-06 NOTE — Progress Notes (Signed)
D: Pt denies SI/HI/AVH, verbally contracts for safety. Pt. Is pleasant and cooperative, affect mostly depressed and flat, but does smile occasionally. Pt. Mood depressed and anxious, requires PRN medications for comfort. Pt. Depression and anxiety reportedly, "7/10" and "10/10" respectively. Pt. Reports minor headache, relieved with PRN medications. Pt. Continues to have poor appetite and not want to eat. Pt. Isolative and withdrawn frequently.   A: Q x 15 minute observation checks were completed for safety. Patient was provided with education.  Patient was given/offered medications per orders. Patient  was encourage to attend groups, participate in unit activities and continue with plan of care. Pt. Chart and plans of care reviewed. Pt. Given support and encouragement.   R: Patient is complaint with medication and unit procedures. Pt. Verbalizes she will come to staff if she gets the urge to cut herself.             Precautionary checks every 15 minutes for safety maintained, room free of safety hazards, patient sustains no injury or falls during this shift. Will endorse care to next shift.

## 2018-03-06 NOTE — Discharge Summary (Addendum)
Physician Discharge Summary Note  Patient:  Brenda Hayes is an 22 y.o., female MRN:  [id number] DOB:  04-07-1996 Patient phone:  [phone] (home)  Patient address:   [address] [place] [address],  Total Time spent with patient: 20 minutes plus 15 min on care coordination and documentation  Date of Admission:  03/04/2018 Date of Discharge: 03/07/2018  Reason for Admission:  Suicide attempt.  History of Present Illness:   Identifying data. [name] is a 22 year old female with a history of depression and anxiety.  Chief complaint. "I cut to die."  History of present illness. Information was obtained from the patient and the chart. The patient came to the ER after an episode of cutting her forearm superficially. This was precipitated by brake up with a boyfriend who cheated on her. Last time she cut, was three weeks ago. This time, the patient cut very close to her wrist and felt it was dangerous. She reports many symptoms of depression with poor sleep, increased appetite anhedonia feeling of guilt hopelessness worthlessness, poor energy and concentration social isolation, crying spells and now suicidal thinking.She denies frank psychotic symptoms but did see "things" in the past. No auditory hallucinations. She reports frequent panic attacks, social anxiety, PTSD since the age of 54 when she witnessed her mother killed, and OCD type symptoms with excessive worries checking, cleaning organizing, list making. She reports frequent and profound mood swings as well but no full blown mania. No alcohol or substance abuse.  Past psychiatric history. In treatment since the age of 11. She has been in the care of Dr. Janeece Riggers prescribed Wellbutrin 300 mg, Ativan 1 mg BID and BuSpar. Took Seroquel, Zoloft, Prozac and Lexapro in the past.  Family psychiatric history. Grandfather and aunt attempted suicide.  Social history. Divorced. Works at [employer]. Lives  independently.  Principal Problem: Major depressive disorder, recurrent severe without psychotic features [facility]) Discharge Diagnoses: Patient Active Problem List   Diagnosis Date Noted  . Major depressive disorder, recurrent severe without psychotic features (HCC) [F33.2] 03/04/2018    Priority: High  . OCD (obsessive compulsive disorder) [F42.9] 03/05/2018  . PTSD (post-traumatic stress disorder) [F43.10] 03/05/2018  . Self-injurious behavior [Z72.89] 03/04/2018  . Suicide attempt [facility]) [T14.91XA] 03/04/2018   Past Medical History:  Past Medical History:  Diagnosis Date  . Anxiety   . Depression     Past Surgical History:  Procedure Laterality Date  . WISDOM TOOTH EXTRACTION     x4 extracted    Family History:  Family History  Problem Relation Age of Onset  . Breast cancer Paternal Aunt   . Breast cancer Maternal Grandmother     Social History:  Social History   Substance and Sexual Activity  Alcohol Use No     Social History   Substance and Sexual Activity  Drug Use No    Social History   Socioeconomic History  . Marital status: Legally Separated    Spouse name: Not on file  . Number of children: Not on file  . Years of education: Not on file  . Highest education level: Not on file  Occupational History  . Not on file  Social Needs  . Financial resource strain: Not on file  . Food insecurity:    Worry: Not on file    Inability: Not on file  . Transportation needs:    Medical: Not on file    Non-medical: Not on file  Tobacco Use  . Smoking status: Never Smoker  .  Smokeless tobacco: Never Used  Substance and Sexual Activity  . Alcohol use: No  . Drug use: No  . Sexual activity: Yes    Birth control/protection: None  Lifestyle  . Physical activity:    Days per week: Not on file    Minutes per session: Not on file  . Stress: Not on file  Relationships  . Social connections:    Talks on phone: Not on file    Gets together: Not on file     Attends religious service: Not on file    Active member of club or organization: Not on file    Attends meetings of clubs or organizations: Not on file    Relationship status: Not on file  Other Topics Concern  . Not on file  Social History Narrative  . Not on file    Hospital Course:    [name] is a 22 year old female with a history of depression and self injudious behavior admitted after episode of cutting in the context of relationship problems. She tolerated medication adjustment well. At the time of discharge, the patient is no longer suicidal, homicidal or excessively anxious. She is able to contract for safety. She is forward thinking and optimistic about the future.  #Mood, improved -continue Wellbutrin 300 mg daily  #Anxiety, improved -continue Luvox 100 mg nightly -continue Minipress 2 mg BID -continue BuSpar 10 mg BID  #Migraine headache -Imitrex 50 mg PRN  #Asthma -continue inhalers  #Disposition -discharge to home -follow up with Dr. Janeece Riggers at [facility]  Physical Findings: AIMS:  , ,  ,  ,    CIWA:    COWS:     Musculoskeletal: Strength & Muscle Tone: within normal limits Gait & Station: normal Patient leans: N/A  Psychiatric Specialty Exam: Physical Exam  Nursing note and vitals reviewed. Psychiatric: She has a normal mood and affect. Her speech is normal and behavior is normal. Thought content normal. Cognition and memory are normal. She expresses impulsivity.    Review of Systems  Neurological: Negative.   Psychiatric/Behavioral: Negative.   All other systems reviewed and are negative.   Blood pressure 123/68, pulse 83, temperature 98 F (36.7 C), temperature source Oral, resp. rate 18, height 5' (1.524 m), weight 91.2 kg, last menstrual period 02/25/2018, SpO2 100 %.Body mass index is 39.26 kg/m.  General Appearance: Casual  Eye Contact:  Good  Speech:  Clear and Coherent  Volume:  Normal  Mood:  Euthymic  Affect:  Appropriate  Thought  Process:  Goal Directed and Descriptions of Associations: Intact  Orientation:  Full (Time, Place, and Person)  Thought Content:  WDL  Suicidal Thoughts:  No  Homicidal Thoughts:  No  Memory:  Immediate;   Fair Recent;   Fair Remote;   Fair  Judgement:  Impaired  Insight:  Shallow  Psychomotor Activity:  Normal  Concentration:  Concentration: Fair and Attention Span: Fair  Recall:  [employer] of Knowledge:  Fair  Language:  Fair  Akathisia:  No  Handed:  Right  AIMS (if indicated):     Assets:  Communication Skills Desire for Improvement Financial Resources/Insurance Housing Physical Health Resilience Social Support Transportation Vocational/Educational  ADL's:  Intact  Cognition:  WNL  Sleep:  Number of Hours: 7.5     Have you used any form of tobacco in the last 30 days? (Cigarettes, Smokeless Tobacco, Cigars, and/or Pipes): No  Has this patient used any form of tobacco in the last 30 days? (Cigarettes, Smokeless Tobacco, Cigars,  and/or Pipes) Yes, No  Blood Alcohol level:  Lab Results  Component Value Date   ETH <10 03/04/2018    Metabolic Disorder Labs:  Lab Results  Component Value Date   HGBA1C 5.0 03/04/2018   MPG 97 03/04/2018   Lab Results  Component Value Date   PROLACTIN 12.8 11/09/2017   Lab Results  Component Value Date   CHOL 143 03/04/2018   TRIG 40 03/04/2018   HDL 53 03/04/2018   CHOLHDL 2.7 03/04/2018   VLDL 8 03/04/2018   LDLCALC 82 03/04/2018    See Psychiatric Specialty Exam and Suicide Risk Assessment completed by Attending Physician prior to discharge.  Discharge destination:  Home  Is patient on multiple antipsychotic therapies at discharge:  No   Has Patient had three or more failed trials of antipsychotic monotherapy by history:  No  Recommended Plan for Multiple Antipsychotic Therapies: NA  Discharge Instructions    Diet - low sodium heart healthy   Complete by:  As directed    Increase activity slowly   Complete  by:  As directed      Allergies as of 03/07/2018      Reactions   Amoxicillin Nausea And Vomiting   Penicillins Anaphylaxis   Has patient had a PCN reaction causing immediate rash, facial/tongue/throat swelling, SOB or lightheadedness with hypotension: Yes Has patient had a PCN reaction causing severe rash involving mucus membranes or skin necrosis: Unknown Has patient had a PCN reaction that required hospitalization: Unknown Has patient had a PCN reaction occurring within the last 10 years: No If all of the above answers are "NO", then may proceed with Cephalosporin use.   Eggs Or Egg-derived Products Hives      Medication List    STOP taking these medications   dicyclomine 20 MG tablet Commonly known as:  BENTYL   LORazepam 1 MG tablet Commonly known as:  ATIVAN   medroxyPROGESTERone 10 MG tablet Commonly known as:  PROVERA   omeprazole 20 MG capsule Commonly known as:  PRILOSEC     TAKE these medications     Indication  albuterol 108 (90 Base) MCG/ACT inhaler Commonly known as:  PROVENTIL HFA;VENTOLIN HFA Inhale 1-2 puffs into the lungs every 6 (six) hours as needed for wheezing or shortness of breath. What changed:  Another medication with the same name was removed. Continue taking this medication, and follow the directions you see here.  Indication:  Asthma   buPROPion 300 MG 24 hr tablet Commonly known as:  WELLBUTRIN XL Take 300 mg by mouth daily.  Indication:  Major Depressive Disorder   busPIRone 10 MG tablet Commonly known as:  BUSPAR Take 10 mg by mouth 2 (two) times daily.  Indication:  Anxiety Disorder, Major Depressive Disorder   fluvoxaMINE 100 MG tablet Commonly known as:  LUVOX Take 1 tablet (100 mg total) by mouth at bedtime.  Indication:  Major Depressive Disorder, Obsessive Compulsive Disorder, Posttraumatic Stress Disorder   hydrOXYzine 50 MG tablet Commonly known as:  ATARAX/VISTARIL Take 1 tablet (50 mg total) by mouth 3 (three) times  daily as needed for anxiety.  Indication:  Feeling Anxious   letrozole 2.5 MG tablet Commonly known as:  FEMARA Take 2 tablets (5 mg total) by mouth daily. What changed:    how much to take  additional instructions  Indication:  Infertility   prazosin 2 MG capsule Commonly known as:  MINIPRESS Take 1 capsule (2 mg total) by mouth 2 (two) times daily.  Indication:  PTSD   zolpidem 5 MG tablet Commonly known as:  AMBIEN Take 1 tablet (5 mg total) by mouth at bedtime as needed for sleep.  Indication:  Trouble Sleeping      Follow-up Information    [facility] [place]. Go on 03/25/2018.   Why:  Please follow up on Monday March 25, 2018 at [facility]. Thank you. Contact information: [address] [place] [address] [phone]        [facility] Follow up.   Why:  Here is an agency that accepts your insurance for mental health therapy. Contact information: Address: [address], [place], [place] [address] Hours: Monday 9AM-5PM Tuesday 9AM-6PM Wednesday 9AM-6PM  Thursday 9AM-6PM Friday 9AM-5PM Saturday Closed Sunday Closed  Phone: [phone]          Follow-up recommendations:  Activity:  as tolerated Diet:  regular Other:  keep follow up appointments  Comments:     Signed: Kristine Linea, MD 03/07/2018, 10:27 AM

## 2018-03-06 NOTE — [facility] Group Notes (Signed)
03/05/2018 1PM  Type of Therapy/Topic:  Group Therapy:  Feelings about Diagnosis  Participation Level:  Did Not Attend   Description of Group:   This group will allow patients to explore their thoughts and feelings about diagnoses they have received. Patients will be guided to explore their level of understanding and acceptance of these diagnoses. Facilitator will encourage patients to process their thoughts and feelings about the reactions of others to their diagnosis and will guide patients in identifying ways to discuss their diagnosis with significant others in their lives. This group will be process-oriented, with patients participating in exploration of their own experiences, giving and receiving support, and processing challenge from other group members.   Therapeutic Goals: 1. Patient will demonstrate understanding of diagnosis as evidenced by identifying two or more symptoms of the disorder 2. Patient will be able to express two feelings regarding the diagnosis 3. Patient will demonstrate their ability to communicate their needs through discussion and/or role play  Summary of Patient Progress:  Patient was encouraged and invited to attend group. Patient did not attend group. Social worker will continue to encourage group participation in the future.       Therapeutic Modalities:   Cognitive Behavioral Therapy Brief Therapy Feelings Identification    Lonnette Shrode, LCSW 03/06/2018 11:33 AM  

## 2018-03-06 NOTE — Progress Notes (Signed)
[facility] MD Progress Note  03/06/2018 12:34 PM Brenda Hayes  MRN:  [id number]  Subjective:    Brenda Hayes met with treatment team today. She is still depressed and has urges to cut. She is able to contract for safety here. Could not sleep last night and has a splitting headache today. She seems to tolerate Luvox well. Start minipress for PTSD.  Principal Problem: Major depressive disorder, recurrent severe without psychotic features (Brenda Hayes) Diagnosis:   Patient Active Problem List   Diagnosis Date Noted  . Major depressive disorder, recurrent severe without psychotic features (Brenda Hayes) [F33.2] 03/04/2018    Priority: High  . OCD (obsessive compulsive disorder) [F42.9] 03/05/2018  . PTSD (post-traumatic stress disorder) [F43.10] 03/05/2018  . Self-injurious behavior [Z72.89] 03/04/2018  . Suicide attempt Brenda Hayes) [T14.91XA] 03/04/2018   Total Time spent with patient: 20 minutes  Past Psychiatric History: depression, anxiety, cutting  Past Medical History:  Past Medical History:  Diagnosis Date  . Anxiety   . Depression     Past Surgical History:  Procedure Laterality Date  . WISDOM TOOTH EXTRACTION     x4 extracted    Family History:  Family History  Problem Relation Age of Onset  . Breast cancer Paternal Aunt   . Breast cancer Maternal Grandmother    Family Psychiatric  History: depression Social History:  Social History   Substance and Sexual Activity  Alcohol Use No     Social History   Substance and Sexual Activity  Drug Use No    Social History   Socioeconomic History  . Marital status: Legally Separated    Spouse name: Not on file  . Number of children: Not on file  . Years of education: Not on file  . Highest education level: Not on file  Occupational History  . Not on file  Social Needs  . Financial resource strain: Not on file  . Food insecurity:    Worry: Not on file    Inability: Not on file  . Transportation needs:    Medical: Not on file     Non-medical: Not on file  Tobacco Use  . Smoking status: Never Smoker  . Smokeless tobacco: Never Used  Substance and Sexual Activity  . Alcohol use: No  . Drug use: No  . Sexual activity: Yes    Birth control/protection: None  Lifestyle  . Physical activity:    Days per week: Not on file    Minutes per session: Not on file  . Stress: Not on file  Relationships  . Social connections:    Talks on phone: Not on file    Gets together: Not on file    Attends religious service: Not on file    Active member of club or organization: Not on file    Attends meetings of clubs or organizations: Not on file    Relationship status: Not on file  Other Topics Concern  . Not on file  Social History Narrative  . Not on file   Additional Social History:                         Sleep: Poor  Appetite:  Fair  Current Medications: Current Facility-Administered Medications  Medication Dose Route Frequency Provider Last Rate Last Dose  . acetaminophen (TYLENOL) tablet 650 mg  650 mg Oral Q6H PRN Shelisha Gautier B, MD   650 mg at 03/06/18 0845  . albuterol (PROVENTIL HFA;VENTOLIN HFA) 108 (90 Base) MCG/ACT inhaler 1-2  puff  1-2 puff Inhalation Q6H PRN Hussam Muniz B, MD      . alum & mag hydroxide-simeth (MAALOX/MYLANTA) 200-200-20 MG/5ML suspension 30 mL  30 mL Oral Q4H PRN Bettie Capistran B, MD      . buPROPion (WELLBUTRIN XL) 24 hr tablet 300 mg  300 mg Oral Daily Amarionna Arca B, MD   300 mg at 03/06/18 0845  . fluvoxaMINE (LUVOX) tablet 50 mg  50 mg Oral QHS Fergie Sherbert B, MD   50 mg at 03/05/18 2104  . hydrOXYzine (ATARAX/VISTARIL) tablet 50 mg  50 mg Oral TID PRN Kambria Grima B, MD   50 mg at 03/05/18 2104  . magnesium hydroxide (MILK OF MAGNESIA) suspension 30 mL  30 mL Oral Daily PRN Maisie Hauser B, MD      . zolpidem (AMBIEN) tablet 5 mg  5 mg Oral QHS PRN Salihah Peckham B, MD   5 mg at 03/05/18 2104    Lab Results: No results  found for this or any previous visit (from the past 48 hour(s)).  Blood Alcohol level:  Lab Results  Component Value Date   ETH <10 70/26/3785    Metabolic Disorder Labs: Lab Results  Component Value Date   HGBA1C 5.0 03/04/2018   MPG 97 03/04/2018   Lab Results  Component Value Date   PROLACTIN 12.8 11/09/2017   Lab Results  Component Value Date   CHOL 143 03/04/2018   TRIG 40 03/04/2018   HDL 53 03/04/2018   CHOLHDL 2.7 03/04/2018   VLDL 8 03/04/2018   LDLCALC 82 03/04/2018    Physical Findings: AIMS:  , ,  ,  ,    CIWA:    COWS:     Musculoskeletal: Strength & Muscle Tone: within normal limits Gait & Station: normal Patient leans: N/A  Psychiatric Specialty Exam: Physical Exam  Nursing note and vitals reviewed. Psychiatric: Her speech is normal and behavior is normal. Thought content normal. Her affect is blunt. Cognition and memory are normal. She expresses impulsivity. She exhibits a depressed mood.    Review of Systems  Neurological: Positive for headaches.  Psychiatric/Behavioral: Positive for depression.  All other systems reviewed and are negative.   Blood pressure 114/69, pulse (!) 111, temperature 97.8 F (36.6 C), temperature source Oral, resp. rate 18, height 5' (1.524 m), weight 91.2 kg, last menstrual period 02/25/2018, SpO2 100 %.Body mass index is 39.26 kg/m.  General Appearance: Casual  Eye Contact:  Good  Speech:  Clear and Coherent  Volume:  Normal  Mood:  Depressed  Affect:  Blunt  Thought Process:  Goal Directed and Descriptions of Associations: Intact  Orientation:  Full (Time, Place, and Person)  Thought Content:  WDL  Suicidal Thoughts:  No  Homicidal Thoughts:  No  Memory:  Immediate;   Fair Recent;   Fair Remote;   Fair  Judgement:  Impaired  Insight:  Shallow  Psychomotor Activity:  Normal  Concentration:  Concentration: Fair and Attention Span: Fair  Recall:  [employer] of Knowledge:  Fair  Language:  Fair   Akathisia:  No  Handed:  Right  AIMS (if indicated):     Assets:  Communication Skills Desire for Improvement Financial Resources/Insurance Housing Physical Health Resilience Social Support Transportation Vocational/Educational  ADL's:  Intact  Cognition:  WNL  Sleep:  Number of Hours: 7     Treatment Plan Summary: Daily contact with patient to assess and evaluate symptoms and progress in treatment and Medication management  Ms. Richardson is a 22 year old female with a history of depression and self injudious behavior admitted after episode of cutting in the context of relationship problems.   #Suicidal ideation -patient is able to contract for safety in the [facility]  #Mood -continue Wellbutrin 300 mg daily -consider mood stabilizer  #Anxiety -stop Ativan 1 mg BID from Dr. Kasandra Knudsen -increase Luvox 100 mg nightly -start Minipress 2 mg BID  #Migraine headache -Imitrex 50 mg PRN  #Asthma -continue inhalers  #GERD -Protonix 40 mg daily  #Disposition -discharge to home -follow up with Dr. Kasandra Knudsen at [place], MD 03/06/2018, 12:34 PM

## 2018-03-06 NOTE — Telephone Encounter (Signed)
Pt aware via voicemail order has been released for draw station

## 2018-03-06 NOTE — Progress Notes (Signed)
D- Patient alert and oriented. Patient presents in a pleasant mood on assessment stating that she didn't sleep too good last night with complaints of a headache, rating her pain a "6/10". Patient did request pain medication from this writer, however, she did state that her headache was still there on reassessment. Patient rated her depression a "7/10" and her anxiety a "10/10" reporting that "my life's just depressing", and "not knowing, situations, and I stress myself by worrying about everything" is why she's anxious. Patient denies SI, HI, AVH, at time of assessment stating "not today" to this Clinical research associate. Patient's goals for today is to "go home and to not focus so much on the bad", in which she will "make myself be social and go to groups" in order to accomplish this goal.  A- Scheduled medications administered to patient, per MD orders. Support and encouragement provided.  Routine safety checks conducted every 15 minutes.  Patient informed to notify staff with problems or concerns.  R- No adverse drug reactions noted. Patient contracts for safety at this time. Patient compliant with medications and treatment plan. Patient receptive, calm, and cooperative. Patient interacts well with others on the unit.  Patient remains safe at this time.

## 2018-03-07 NOTE — [facility] Group Notes (Signed)
[facility] Group Notes:  (Nursing/MHT/Case Management/Adjunct)  Date:  03/07/2018  Time:  2:05 AM  Type of Therapy:  Group Therapy0  Participation Level:  Active  Participation Quality:  Appropriate  Affect:  Appropriate  Cognitive:  Alert  Insight:  Good  Engagement in Group:  Supportive  Modes of Intervention:  Support  Summary of Progress/Problems:  Brenda Hayes 03/07/2018, 2:05 AM

## 2018-03-07 NOTE — Progress Notes (Signed)
Patient ID: Brenda Hayes, female   DOB: 06/25/1995, 22 y.o.   MRN: [id number]  Discharge Note:  Patient denies SI/HI/AVH at this time. Discharge instructions, AVS, prescriptions, and transition record gone over with patient and family. Patient agrees to comply with medication management, follow-up visit, and outpatient therapy. Patient belongings returned to patient. Patient questions and concerns addressed and answered. Patient ambulatory off unit. Patient discharged to home with friend.

## 2018-03-07 NOTE — Progress Notes (Signed)
Recreation Therapy Notes  Date: 03/07/2018  Time: 9:30 am  Location: Craft Room  Behavioral response: Appropriate   Intervention Topic: Problem Solving  Discussion/Intervention:  Group content on today was focused on problem solving. The group described what problem solving is. Patients expressed how problems affect them and how they deal with problems. Individuals identified healthy ways to deal with problems. Patients explained what normally happens to them when they do not deal with problems. The group expressed reoccurring problems for them. The group participated in the intervention "Ways to Solve problems" where patients were given a chance to explore different ways to solve problems.  Clinical Observations/Feedback:  Patient came to group and was focused on what her peers and staff had to say about problem solving. Individual participated in the intervention.   Brenda Hayes LRT/CTRS         [name] 03/07/2018 11:14 AM

## 2018-03-07 NOTE — Progress Notes (Signed)
Recreation Therapy Notes  INPATIENT RECREATION TR PLAN  Patient Details Name: Brenda Hayes MRN: [id number] DOB: 1995-09-01 Today's Date: 03/07/2018  Rec Therapy Plan Is patient appropriate for Therapeutic Recreation?: Yes Treatment times per week: at least 3 Estimated Length of Stay: 5-7 days TR Treatment/Interventions: Group participation (Comment)  Discharge Criteria Pt will be discharged from therapy if:: Discharged Treatment plan/goals/alternatives discussed and agreed upon by:: Patient/family  Discharge Summary Short term goals set: Patient will identify 3 ways to improve self-esteem within 5 recreation therapy group sessions Short term goals met: Adequate for discharge Progress toward goals comments: Groups attended Which groups?: Coping skills, Other (Comment)(Problem Solving) Reason goals not met: N/A Therapeutic equipment acquired: N/A Reason patient discharged from therapy: Discharge from hospital Pt/family agrees with progress & goals achieved: Yes Date patient discharged from therapy: 03/07/18   Jemmie Ledgerwood 03/07/2018, 12:32 PM

## 2018-03-07 NOTE — [facility] Group Notes (Signed)
LCSW Group Therapy Note  03/07/2018 1:15pm  Type of Therapy/Topic:  Group Therapy:  Balance in Life  Participation Level:  Did Not Attend  Description of Group:    This group will address the concept of balance and how it feels and looks when one is unbalanced. Patients will be encouraged to process areas in their lives that are out of balance and identify reasons for remaining unbalanced. Facilitators will guide patients in utilizing problem-solving interventions to address and correct the stressor making their life unbalanced. Understanding and applying boundaries will be explored and addressed for obtaining and maintaining a balanced life. Patients will be encouraged to explore ways to assertively make their unbalanced needs known to significant others in their lives, using other group members and facilitator for support and feedback.  Therapeutic Goals: 1. Patient will identify two or more emotions or situations they have that consume much of in their lives. 2. Patient will identify signs/triggers that life has become out of balance:  3. Patient will identify two ways to set boundaries in order to achieve balance in their lives:  4. Patient will demonstrate ability to communicate their needs through discussion and/or role plays  Summary of Patient Progress:      Therapeutic Modalities:   Cognitive Behavioral Therapy Solution-Focused Therapy Assertiveness Training  Cheron Schaumann, [place] 03/07/2018 3:23 PM

## 2018-03-07 NOTE — Progress Notes (Signed)
  [facility] Adult Case Management Discharge Plan :  Will you be returning to the same living situation after discharge:  Yes,  Home At discharge, do you have transportation home?: Yes,  Friend will come at discharge Do you have the ability to pay for your medications: Yes,  [employer]  Release of information consent forms completed and in the chart;  Patient's signature needed at discharge.  Patient to Follow up at: Follow-up Information    [facility], [place]. Go on 03/25/2018.   Why:  Please follow up on Monday March 25, 2018 at [facility]. Thank you. Contact information: [address] [place] [address] [phone]        [facility] Follow up.   Why:  Here is an agency that accepts your insurance for mental health therapy. Contact information: Address: [address], [place], [place] [address] Hours: Monday 9AM-5PM Tuesday 9AM-6PM Wednesday 9AM-6PM  Thursday 9AM-6PM Friday 9AM-5PM Saturday Closed Sunday Closed  Phone: [phone]          Next level of care provider has access to [facility] Link:no  Safety Planning and Suicide Prevention discussed: Yes,  Completed with patient  Have you used any form of tobacco in the last 30 days? (Cigarettes, Smokeless Tobacco, Cigars, and/or Pipes): No  Has patient been referred to the Quitline?: N/A patient is not a smoker  Patient has been referred for addiction treatment: N/A  Johny Shears, LCSW 03/07/2018, 8:23 AM

## 2018-03-07 NOTE — Progress Notes (Signed)
Patient alert and oriented, affect is flat but brightens upon approach, thoughts are organized and coherent no bizarre behavior noted. Patient appears less anxious, able to interact appropriately with peers nd staff. Patient rated anxiety a 5/10 .  Patient was offered support and encouragement and advised to attend evening wrap up group. Patient attends group interacts appropriately, complaint with medication. No distress noted , 15 minutes checks maintained will continue to monitor.

## 2018-03-07 NOTE — Plan of Care (Signed)
Patient noted interacting with peers appears less anxious

## 2018-03-12 ENCOUNTER — Other Ambulatory Visit: Payer: Managed Care, Other (non HMO)

## 2018-03-13 ENCOUNTER — Other Ambulatory Visit (HOSPITAL_COMMUNITY)
Admission: RE | Admit: 2018-03-13 | Discharge: 2018-03-13 | Disposition: A | Discharge status: Home / Self Care | Payer: Managed Care, Other (non HMO) | Source: Ambulatory Visit | Attending: Obstetrics & Gynecology | Admitting: Obstetrics & Gynecology

## 2018-03-13 ENCOUNTER — Ambulatory Visit (INDEPENDENT_AMBULATORY_CARE_PROVIDER_SITE_OTHER): Payer: Managed Care, Other (non HMO) | Admitting: Obstetrics & Gynecology

## 2018-03-13 ENCOUNTER — Encounter: Payer: Self-pay | Admitting: Obstetrics & Gynecology

## 2018-03-13 VITALS — BP 100/70 | Ht 63.0 in | Wt 202.0 lb

## 2018-03-13 DIAGNOSIS — Z124 Encounter for screening for malignant neoplasm of cervix: Secondary | ICD-10-CM | POA: Insufficient documentation

## 2018-03-13 DIAGNOSIS — Z113 Encounter for screening for infections with a predominantly sexual mode of transmission: Secondary | ICD-10-CM | POA: Insufficient documentation

## 2018-03-13 DIAGNOSIS — N970 Female infertility associated with anovulation: Secondary | ICD-10-CM

## 2018-03-13 DIAGNOSIS — Z01419 Encounter for gynecological examination (general) (routine) without abnormal findings: Secondary | ICD-10-CM

## 2018-03-13 DIAGNOSIS — Z0000 Encounter for general adult medical examination without abnormal findings: Secondary | ICD-10-CM

## 2018-03-13 LAB — PROGESTERONE: Progesterone: 0.8 ng/mL

## 2018-03-13 NOTE — Patient Instructions (Signed)
PAP every  year Labs yearly age 22

## 2018-03-13 NOTE — Progress Notes (Signed)
HPI:      Ms. Brenda Hayes is a 22 y.o. G1P0010, LMP 03/03/18, she presents today for her annual examination. On Letrozol for fertility, has been on 3 mos, sees Dr Bonney Aid.   Recent hospitalization due to depression and suicidal ideation, sees psychiatrist and is on adjusted medication therapy. The patient is sexually active. Her last pap: was normal. The patient does perform self breast exams.  There is no notable family history of breast or ovarian cancer in her family.  The patient has regular exercise: yes.  The patient denies current symptoms of depression.    GYN History: Contraception: none  PMHx: Past Medical History:  Diagnosis Date  . Anxiety   . Depression    Past Surgical History:  Procedure Laterality Date  . WISDOM TOOTH EXTRACTION     x4 extracted    Family History  Problem Relation Age of Onset  . Breast cancer Paternal Aunt   . Breast cancer Maternal Grandmother    Social History   Tobacco Use  . Smoking status: Never Smoker  . Smokeless tobacco: Never Used  Substance Use Topics  . Alcohol use: No  . Drug use: No    Current Outpatient Medications:  .  albuterol (PROVENTIL HFA;VENTOLIN HFA) 108 (90 Base) MCG/ACT inhaler, Inhale 1-2 puffs into the lungs every 6 (six) hours as needed for wheezing or shortness of breath., Disp: , Rfl:  .  buPROPion (WELLBUTRIN XL) 300 MG 24 hr tablet, Take 300 mg by mouth daily., Disp: , Rfl: 1 .  busPIRone (BUSPAR) 10 MG tablet, Take 10 mg by mouth 2 (two) times daily., Disp: , Rfl: 3 .  fluvoxaMINE (LUVOX) 100 MG tablet, Take 1 tablet (100 mg total) by mouth at bedtime., Disp: 30 tablet, Rfl: 1 .  hydrOXYzine (ATARAX/VISTARIL) 50 MG tablet, Take 1 tablet (50 mg total) by mouth 3 (three) times daily as needed for anxiety., Disp: 30 tablet, Rfl: 0 .  letrozole (FEMARA) 2.5 MG tablet, Take 2 tablets (5 mg total) by mouth daily. (Patient taking differently: Take 2.5 mg by mouth daily. Take 1 tablet daily for 5 days once  a month to ovulate.), Disp: 10 tablet, Rfl: 0 .  prazosin (MINIPRESS) 2 MG capsule, Take 1 capsule (2 mg total) by mouth 2 (two) times daily., Disp: 60 capsule, Rfl: 1 .  zolpidem (AMBIEN) 5 MG tablet, Take 1 tablet (5 mg total) by mouth at bedtime as needed for sleep., Disp: 30 tablet, Rfl: 0 Allergies: Amoxicillin; Penicillins; and Eggs or egg-derived products  Review of Systems  Constitutional: Negative for chills, fever and malaise/fatigue.  HENT: Negative for congestion, sinus pain and sore throat.   Eyes: Negative for blurred vision and pain.  Respiratory: Negative for cough and wheezing.   Cardiovascular: Negative for chest pain and leg swelling.  Gastrointestinal: Negative for abdominal pain, constipation, diarrhea, heartburn, nausea and vomiting.  Genitourinary: Negative for dysuria, frequency, hematuria and urgency.  Musculoskeletal: Negative for back pain, joint pain, myalgias and neck pain.  Skin: Negative for itching and rash.  Neurological: Negative for dizziness, tremors and weakness.  Endo/Heme/Allergies: Does not bruise/bleed easily.  Psychiatric/Behavioral: Positive for depression. The patient is nervous/anxious. The patient does not have insomnia.     Objective: BP 100/70   Ht 5\' 3"  (1.6 m)   Wt 202 lb (91.6 kg)   LMP 02/25/2018   BMI 35.78 kg/m   Filed Weights   03/13/18 1435  Weight: 202 lb (91.6 kg)   Body mass  index is 35.78 kg/m. Physical Exam  Constitutional: She is oriented to person, place, and time. She appears well-developed and well-nourished. No distress.  Genitourinary: Rectum normal, vagina normal and uterus normal. Pelvic exam was performed with patient supine. There is no rash or lesion on the right labia. There is no rash or lesion on the left labia. Vagina exhibits no lesion. No bleeding in the vagina. Right adnexum does not display mass and does not display tenderness. Left adnexum does not display mass and does not display tenderness. Cervix  does not exhibit motion tenderness, lesion, friability or polyp.   Uterus is mobile and midaxial. Uterus is not enlarged or exhibiting a mass.  HENT:  Head: Normocephalic and atraumatic. Head is without laceration.  Right Ear: Hearing normal.  Left Ear: Hearing normal.  Nose: No epistaxis.  No foreign bodies.  Mouth/Throat: Uvula is midline, oropharynx is clear and moist and mucous membranes are normal.  Eyes: Pupils are equal, round, and reactive to light.  Neck: Normal range of motion. Neck supple. No thyromegaly present.  Cardiovascular: Normal rate and regular rhythm. Exam reveals no gallop and no friction rub.  No murmur heard. Pulmonary/Chest: Effort normal and breath sounds normal. No respiratory distress. She has no wheezes. Right breast exhibits no mass, no skin change and no tenderness. Left breast exhibits no mass, no skin change and no tenderness.  Abdominal: Soft. Bowel sounds are normal. She exhibits no distension. There is no tenderness. There is no rebound.  Musculoskeletal: Normal range of motion.  Neurological: She is alert and oriented to person, place, and time. No cranial nerve deficit.  Skin: Skin is warm and dry.  Psychiatric: She has a normal mood and affect. Judgment normal.  Vitals reviewed.   Assessment:  ANNUAL EXAM 1. Annual physical exam   2. Screening for cervical cancer   3. Screen for STD (sexually transmitted disease)   4. Infertility associated with anovulation     Screening Plan:            1.  Cervical Screening-  Pap smear done today  2. Counseling for contraception: no method   3. Infertility- cont to see Dr Bonney Aid, Brenda Hayes.  Other options discussed.  4. Depression, cont to see in follow up her psychiatrist.  No suicidal ideation today.    F/U  Return in about 1 year (around 03/14/2019) for Annual.  Annamarie Major, MD, [facility], [facility] 03/13/2018  2:55 PM

## 2018-03-14 LAB — CYTOLOGY - PAP
Chlamydia: NEGATIVE
Diagnosis: NEGATIVE
NEISSERIA GONORRHEA: NEGATIVE
Trichomonas: NEGATIVE

## 2018-03-28 ENCOUNTER — Other Ambulatory Visit: Payer: Self-pay | Admitting: Obstetrics and Gynecology

## 2018-03-28 DIAGNOSIS — N970 Female infertility associated with anovulation: Secondary | ICD-10-CM

## 2018-03-28 MED ORDER — LETROZOLE 2.5 MG PO TABS: 5.0000 mg | ORAL_TABLET | Freq: Every day | ORAL | 0 refills | Status: DC

## 2018-03-28 NOTE — Progress Notes (Signed)
LMP 03/18/2018 Cycle I letrzole 5mg  with day 21 progesterone on Wednesday 04/17/18

## 2018-04-15 ENCOUNTER — Other Ambulatory Visit: Payer: Self-pay

## 2018-04-15 ENCOUNTER — Telehealth: Payer: Self-pay

## 2018-04-15 DIAGNOSIS — N970 Female infertility associated with anovulation: Secondary | ICD-10-CM

## 2018-04-15 NOTE — Telephone Encounter (Signed)
Pt is calling triage asking to release progesterone labs to [facility] so she can have them drawn at [address] [place], [place] [address]. Per Andrey Campanile she has released them. Pt aware.

## 2018-04-18 LAB — PROGESTERONE: Progesterone: 25.9 ng/mL

## 2018-05-01 ENCOUNTER — Telehealth: Payer: Self-pay

## 2018-05-01 ENCOUNTER — Other Ambulatory Visit: Payer: Self-pay | Admitting: Obstetrics and Gynecology

## 2018-05-01 ENCOUNTER — Other Ambulatory Visit: Payer: Managed Care, Other (non HMO)

## 2018-05-01 DIAGNOSIS — O3680X0 Pregnancy with inconclusive fetal viability, not applicable or unspecified: Secondary | ICD-10-CM

## 2018-05-01 NOTE — Telephone Encounter (Signed)
Pt calling asking for orders to be released as they are put in for future. Would like to have drawn today. [phone].

## 2018-05-01 NOTE — Telephone Encounter (Signed)
Order has been released. She is on the schedule for 3:30 [facility]

## 2018-05-02 LAB — BETA HCG QUANT (REF LAB): hCG Quant: 2991 m[IU]/mL

## 2018-05-02 NOTE — Telephone Encounter (Signed)
[facility] needed if I have more availability there

## 2018-05-06 NOTE — Telephone Encounter (Signed)
Patient is schedule 05/08/18 with AMS

## 2018-05-08 ENCOUNTER — Encounter: Payer: Managed Care, Other (non HMO) | Admitting: Obstetrics and Gynecology

## 2018-05-10 NOTE — Telephone Encounter (Signed)
Called and left voicemail for patient to call back to be schedule for ultrasound.  °

## 2018-05-10 NOTE — Telephone Encounter (Signed)
Sent message thru my chart

## 2018-05-10 NOTE — Telephone Encounter (Signed)
[facility]

## 2018-05-10 NOTE — Telephone Encounter (Signed)
Patient is schedule on 05/15/18 for ultrasound in [place] and will follow up with AMS in [place] office. Dr. Bonney AidStaebler could you please put in order for ultrasound Sir. Please and thank you

## 2018-05-11 ENCOUNTER — Emergency Department
Admission: EM | Admit: 2018-05-11 | Discharge: 2018-05-11 | Disposition: A | Discharge status: Home / Self Care | Payer: Managed Care, Other (non HMO) | Attending: Emergency Medicine | Admitting: Emergency Medicine

## 2018-05-11 ENCOUNTER — Encounter: Payer: Self-pay | Admitting: Emergency Medicine

## 2018-05-11 ENCOUNTER — Emergency Department: Payer: Managed Care, Other (non HMO)

## 2018-05-11 DIAGNOSIS — R102 Pelvic and perineal pain: Secondary | ICD-10-CM | POA: Diagnosis not present

## 2018-05-11 DIAGNOSIS — O26891 Other specified pregnancy related conditions, first trimester: Secondary | ICD-10-CM

## 2018-05-11 DIAGNOSIS — O2341 Unspecified infection of urinary tract in pregnancy, first trimester: Secondary | ICD-10-CM | POA: Diagnosis not present

## 2018-05-11 DIAGNOSIS — Z79899 Other long term (current) drug therapy: Secondary | ICD-10-CM | POA: Diagnosis not present

## 2018-05-11 DIAGNOSIS — Z3A01 Less than 8 weeks gestation of pregnancy: Secondary | ICD-10-CM | POA: Insufficient documentation

## 2018-05-11 DIAGNOSIS — O9989 Other specified diseases and conditions complicating pregnancy, childbirth and the puerperium: Secondary | ICD-10-CM | POA: Diagnosis present

## 2018-05-11 DIAGNOSIS — O468X1 Other antepartum hemorrhage, first trimester: Secondary | ICD-10-CM

## 2018-05-11 DIAGNOSIS — O208 Other hemorrhage in early pregnancy: Secondary | ICD-10-CM | POA: Diagnosis not present

## 2018-05-11 DIAGNOSIS — O418X10 Other specified disorders of amniotic fluid and membranes, first trimester, not applicable or unspecified: Secondary | ICD-10-CM

## 2018-05-11 DIAGNOSIS — Z3490 Encounter for supervision of normal pregnancy, unspecified, unspecified trimester: Secondary | ICD-10-CM

## 2018-05-11 LAB — COMPREHENSIVE METABOLIC PANEL
ALT: 23 U/L (ref 0–44)
AST: 20 U/L (ref 15–41)
Albumin: 3.9 g/dL (ref 3.5–5.0)
Alkaline Phosphatase: 75 U/L (ref 38–126)
Anion gap: 6 (ref 5–15)
BUN: 10 mg/dL (ref 6–20)
CO2: 22 mmol/L (ref 22–32)
Calcium: 8.8 mg/dL — ABNORMAL LOW (ref 8.9–10.3)
Chloride: 107 mmol/L (ref 98–111)
Creatinine, Ser: 0.64 mg/dL (ref 0.44–1.00)
GFR calc Af Amer: >60 mL/min (ref >60)
Glucose, Bld: 96 mg/dL (ref 70–99)
Potassium: 3.9 mmol/L (ref 3.5–5.1)
Sodium: 135 mmol/L (ref 135–145)
Total Bilirubin: 0.6 mg/dL (ref 0.3–1.2)
Total Protein: 6.9 g/dL (ref 6.5–8.1)

## 2018-05-11 LAB — URINALYSIS, COMPLETE (UACMP) WITH MICROSCOPIC
Bilirubin Urine: NEGATIVE
Glucose, UA: NEGATIVE mg/dL
Hgb urine dipstick: NEGATIVE
Ketones, ur: NEGATIVE mg/dL
Nitrite: NEGATIVE
Protein, ur: NEGATIVE mg/dL
SPECIFIC GRAVITY, URINE: 1.014 (ref 1.005–1.030)
pH: 6 (ref 5.0–8.0)

## 2018-05-11 LAB — CBC
HCT: 36 % (ref 36.0–46.0)
Hemoglobin: 12 g/dL (ref 12.0–15.0)
MCH: 30.3 pg (ref 26.0–34.0)
MCHC: 33.3 g/dL (ref 30.0–36.0)
MCV: 90.9 fL (ref 80.0–100.0)
Platelets: 308 10*3/uL (ref 150–400)
RBC: 3.96 MIL/uL (ref 3.87–5.11)
RDW: 13.2 % (ref 11.5–15.5)
WBC: 5.8 10*3/uL (ref 4.0–10.5)
nRBC: 0 % (ref 0.0–0.2)

## 2018-05-11 LAB — LIPASE, BLOOD: LIPASE: 31 U/L (ref 11–51)

## 2018-05-11 LAB — HCG, QUANTITATIVE, PREGNANCY: hCG, Beta Chain, Quant, S: 57418 m[IU]/mL — ABNORMAL HIGH (ref <5)

## 2018-05-11 MED ORDER — NITROFURANTOIN MONOHYD MACRO 100 MG PO CAPS: 100.0000 mg | ORAL_CAPSULE | Freq: Two times a day (BID) | ORAL | 0 refills | Status: AC

## 2018-05-11 MED ORDER — NITROFURANTOIN MONOHYD MACRO 100 MG PO CAPS
100.0000 mg | ORAL_CAPSULE | Freq: Once | ORAL | Status: AC
  Administered 2018-05-11: 100 mg via ORAL
  Filled 2018-05-11: qty 1

## 2018-05-11 NOTE — ED Provider Notes (Signed)
[facility] Emergency Department Provider Note  ____________________________________________   First MD Initiated Contact with Patient 05/11/18 [phone]     (approximate)  I have reviewed the triage vital signs and the nursing notes.   HISTORY  Chief Complaint Abdominal Pain    HPI Quenton FetterCaitlin Marie Hayes is a 22 y.o. female G2 P0 SpAb 1 who believes she is about [redacted] weeks pregnant and presents by private vehicle for evaluation of pelvic pain.  She states that she has been trying to get pregnant for at least 7 months and has been [facility] with Dr. Bonney AidStaebler.  She started having intermittent but severe pelvic cramping last night and she just wants to make sure the baby is okay.  She denies vaginal bleeding.  She has had no dysuria.  She denies fever/chills, chest pain, shortness of breath, vomiting, and any other abdominal pain.  She has had nausea for several weeks but that is unchanged.  Nothing in particular makes the pain better or worse.  Past Medical History:  Diagnosis Date  . Anxiety   . Depression     Patient Active Problem List   Diagnosis Date Noted  . OCD (obsessive compulsive disorder) 03/05/2018  . PTSD (post-traumatic stress disorder) 03/05/2018  . Major depressive disorder, recurrent severe without psychotic features (HCC) 03/04/2018  . Self-injurious behavior 03/04/2018  . Suicide attempt (HCC) 03/04/2018    Past Surgical History:  Procedure Laterality Date  . WISDOM TOOTH EXTRACTION     x4 extracted     Prior to Admission medications   Medication Sig Start Date End Date Taking? Authorizing Provider  albuterol (PROVENTIL HFA;VENTOLIN HFA) 108 (90 Base) MCG/ACT inhaler Inhale 1-2 puffs into the lungs every 6 (six) hours as needed for wheezing or shortness of breath.    [provider]  buPROPion (WELLBUTRIN XL) 300 MG 24 hr tablet Take 300 mg by mouth daily. 02/20/18   [provider]    busPIRone (BUSPAR) 10 MG tablet Take 10 mg by mouth 2 (two) times daily. 02/11/18   [provider]  fluvoxaMINE (LUVOX) 100 MG tablet Take 1 tablet (100 mg total) by mouth at bedtime. 03/06/18   Pucilowska, Braulio ConteJolanta B, MD  hydrOXYzine (ATARAX/VISTARIL) 50 MG tablet Take 1 tablet (50 mg total) by mouth 3 (three) times daily as needed for anxiety. 03/06/18   Pucilowska, Braulio ConteJolanta B, MD  letrozole (FEMARA) 2.5 MG tablet Take 2 tablets (5 mg total) by mouth daily. 03/28/18   Vena AustriaStaebler, Andreas, MD  nitrofurantoin, macrocrystal-monohydrate, (MACROBID) 100 MG capsule Take 1 capsule (100 mg total) by mouth 2 (two) times daily for 7 days. 05/11/18 05/18/18  Loleta RoseForbach, Wealthy Danielski, MD  prazosin (MINIPRESS) 2 MG capsule Take 1 capsule (2 mg total) by mouth 2 (two) times daily. 03/07/18   Pucilowska, Jolanta B, MD  zolpidem (AMBIEN) 5 MG tablet Take 1 tablet (5 mg total) by mouth at bedtime as needed for sleep. 03/06/18   Pucilowska, Ellin GoodieJolanta B, MD    Allergies Amoxicillin; Penicillins; and Eggs or egg-derived products  Family History  Problem Relation Age of Onset  . Breast cancer Paternal Aunt   . Breast cancer Maternal Grandmother     Social History Social History   Tobacco Use  . Smoking status: Never Smoker  . Smokeless tobacco: Never Used  Substance Use Topics  . Alcohol use: No  . Drug use: No    Review of Systems Constitutional: No fever/chills Eyes: No visual changes. ENT: No sore  throat. Cardiovascular: Denies chest pain. Respiratory: Denies shortness of breath. Gastrointestinal: Lower abdominal or pelvic pain.  Nausea, no vomiting.  No diarrhea.  No constipation. Genitourinary: No vaginal bleeding.  Negative for dysuria. Musculoskeletal: Negative for neck pain.  Negative for back pain. Integumentary: Negative for rash. Neurological: Negative for headaches, focal weakness or numbness.   ____________________________________________   PHYSICAL EXAM:  VITAL SIGNS: ED Triage Vitals   Enc Vitals Group     BP 05/11/18 0427 133/68     Pulse Rate 05/11/18 0427 81     Resp 05/11/18 0427 18     Temp 05/11/18 0427 98.2 F (36.8 C)     Temp Source 05/11/18 0427 Oral     SpO2 05/11/18 0427 96 %     Weight 05/11/18 0423 86.2 kg (190 lb)     Height 05/11/18 0423 1.575 m (5\' 2" )     Head Circumference --      Peak Flow --      Pain Score 05/11/18 0423 7     Pain Loc --      Pain Edu? --      Excl. in GC? --     Constitutional: Alert and oriented. Well appearing and in no acute distress. Eyes: Conjunctivae are normal.  Head: Atraumatic. Nose: No congestion/rhinnorhea. Mouth/Throat: Mucous membranes are moist. Neck: No stridor.  No meningeal signs.   Cardiovascular: Normal rate, regular rhythm. Good peripheral circulation. Grossly normal heart sounds. Respiratory: Normal respiratory effort.  No retractions. Lungs CTAB. Gastrointestinal: Soft and nontender. No distention.  Genitourinary: Deferred Musculoskeletal: No lower extremity tenderness nor edema. No gross deformities of extremities. Neurologic:  Normal speech and language. No gross focal neurologic deficits are appreciated.  Skin:  Skin is warm, dry and intact. No rash noted. Psychiatric: Mood and affect are anxious but generally appropriate under the circumstances.  ____________________________________________   LABS (all labs ordered are listed, but only abnormal results are displayed)  Labs Reviewed  COMPREHENSIVE METABOLIC PANEL - Abnormal; Notable for the following components:      Result Value   Calcium 8.8 (*)    All other components within normal limits  URINALYSIS, COMPLETE (UACMP) WITH MICROSCOPIC - Abnormal; Notable for the following components:   Color, Urine YELLOW (*)    APPearance CLEAR (*)    Leukocytes, UA MODERATE (*)    Bacteria, UA RARE (*)    All other components within normal limits  HCG, QUANTITATIVE, PREGNANCY - Abnormal; Notable for the following components:   hCG, Beta Chain,  Quant, S 57,418 (*)    All other components within normal limits  URINE CULTURE  LIPASE, BLOOD  CBC   ____________________________________________  EKG  None - EKG not ordered by ED physician ____________________________________________  RADIOLOGY   ED MD interpretation: Single live fetus at 6 weeks and 0 days gestation, small subchorionic hemorrhage.  Official radiology report(s): US Ob Comp < 14 Wks  Result Date: 05/11/2018 CLINICAL DATA:  Pelvic pain. Estimated gestational age of [redacted] weeks, 2 days by LMP. EXAM: OBSTETRIC <14 WK [place] AND TRANSVAGINAL OB US TECHNIQUE: Both transabdominal and transvaginal ultrasound examinations were performed for complete evaluation of the gestation as well as the maternal uterus, adnexal regions, and pelvic cul-de-sac. Transvaginal technique was performed to assess early pregnancy. COMPARISON:  None. FINDINGS: Intrauterine gestational sac: Single Yolk sac:  Visualized. Embryo:  Visualized. Cardiac Activity: Visualized. Heart Rate: 123 bpm CRL:  4  mm   6 w   0 d  [place] EDC: 01/04/2019 Subchorionic hemorrhage: Small 1.1 x 0.7 x 0.8 cm subchorionic hemorrhage. Maternal uterus/adnexae: Unremarkable.  Left corpus luteum. IMPRESSION: 1. Single, live intrauterine pregnancy with estimated gestational age of [redacted] weeks, 0 days. 2. Small subchorionic hemorrhage. Electronically Signed   By: Obie Dredge M.D.   On: 05/11/2018 07:30   US Ob Transvaginal  Result Date: 05/11/2018 CLINICAL DATA:  Pelvic pain. Estimated gestational age of [redacted] weeks, 2 days by LMP. EXAM: OBSTETRIC <14 WK [place] AND TRANSVAGINAL OB US TECHNIQUE: Both transabdominal and transvaginal ultrasound examinations were performed for complete evaluation of the gestation as well as the maternal uterus, adnexal regions, and pelvic cul-de-sac. Transvaginal technique was performed to assess early pregnancy. COMPARISON:  None. FINDINGS: Intrauterine gestational sac: Single Yolk sac:  Visualized.  Embryo:  Visualized. Cardiac Activity: Visualized. Heart Rate: 123 bpm CRL:  4  mm   6 w   0 d                  [place] EDC: 01/04/2019 Subchorionic hemorrhage: Small 1.1 x 0.7 x 0.8 cm subchorionic hemorrhage. Maternal uterus/adnexae: Unremarkable.  Left corpus luteum. IMPRESSION: 1. Single, live intrauterine pregnancy with estimated gestational age of [redacted] weeks, 0 days. 2. Small subchorionic hemorrhage. Electronically Signed   By: Obie Dredge M.D.   On: 05/11/2018 07:30    ____________________________________________   PROCEDURES  Critical Care performed: No   Procedure(s) performed:   Procedures   ____________________________________________   INITIAL IMPRESSION / ASSESSMENT AND PLAN / ED COURSE  As part of my medical decision making, I reviewed the following data within the electronic MEDICAL RECORD NUMBER Nursing notes reviewed and incorporated, Labs reviewed , Patient signed out to Dr. Darnelle Catalan and Notes from prior ED visits    Differential diagnosis includes, but is not limited to, normal pain associated with pregnancy, ectopic pregnancy, urinary tract infection, less likely PID or TOA.  The patient is having no vaginal bleeding which is reassuring.  She does not appear to be in distress and has normal and stable vital signs.  Quantitative hCG is 57,000 which is within the appropriate range for her estimated gestational age.  Lab work is otherwise normal except for her urinalysis which does show some bacteria and leukocytes.  Given that she is pregnant I will treat empirically with Macrobid.  Ordinarily I would choose Keflex but she has anaphylaxis listed as a reaction to penicillins and I do not want to risk any sort of cross reaction even though it is very unlikely.  I am giving her first dose of Macrobid 100 mg by mouth.  Given her concerns and history, I am ordering a pelvic ultrasound for further evaluation.  I do not feel she would benefit from a pelvic exam at this time given that she  is having no bleeding or excessive discharge.  I explained to her that it may be too early to get a definitive diagnosis but that she can follow-up with [facility] after the results are back.  She understands and agrees with the plan.  Clinical Course as of May 11 746  Sat May 11, 2018  1610 Single live and healthy-appearing fetus at 6 weeks and 0 days gestation.  Very small subchorionic hemorrhage.  I am updating the patient about these results and encouraged outpatient follow-up and pelvic rest.  US OB Comp < 14 Wks [CF]    Clinical Course User Index [CF] Loleta Rose, MD    ____________________________________________  FINAL CLINICAL IMPRESSION(S) / ED  DIAGNOSES  Final diagnoses:  Pelvic pain during pregnancy in first trimester, antepartum  UTI (urinary tract infection) during pregnancy, first trimester  Early stage of pregnancy  Subchorionic hemorrhage of placenta in first trimester, single or unspecified fetus     MEDICATIONS GIVEN DURING THIS VISIT:  Medications  nitrofurantoin (macrocrystal-monohydrate) (MACROBID) capsule 100 mg (100 mg Oral Given 05/11/18 0726)     ED Discharge Orders         Ordered    nitrofurantoin, macrocrystal-monohydrate, (MACROBID) 100 MG capsule  2 times daily     05/11/18 1610           Note:  This document was prepared using Dragon voice recognition software and may include unintentional dictation errors.    Loleta Rose, MD 05/11/18 [phone]

## 2018-05-11 NOTE — ED Notes (Signed)
MD at bedside. 

## 2018-05-11 NOTE — ED Notes (Signed)
Patient transported to Ultrasound 

## 2018-05-11 NOTE — ED Triage Notes (Signed)
Patient states that she is about [redacted] weeks pregnant and started having pelvic pain about midnight last night. Patient denies vaginal bleeding. Patient states nausea but no vomiting.

## 2018-05-11 NOTE — ED Notes (Signed)
Pt returned from US at this time.  Report received from previous shift, care of pt assumed.  PT voices no c/o or needs at this time.  Will monitor.

## 2018-05-11 NOTE — Discharge Instructions (Signed)
As we discussed, your ultrasound was reassuring with a single live fetus at about [redacted] weeks gestation.  You have a very small spot of bleeding between the placenta and uterine wall, a condition called subchorionic hemorrhage or hematoma.  This is a very common finding in early pregnancy and typically resolves on its own.  Your OB/GYN can talk to you more about this, but in the meantime there is nothing to do or to worry about other than to continue to take care of your self.  We recommend pelvic rest (meaning no sexual intercourse, no use of tampons, etc) until otherwise directed by your OB/GYN.  Seek medical attention if you develop any vaginal bleeding, if your pain worsens, or if you develop other symptoms that concern you.  Of note, you may have a mild urinary tract infection, so we gave you a first dose of antibiotics and have sent a prescription to the pharmacy for you to pick up and take for the next week.

## 2018-05-12 LAB — URINE CULTURE
Culture: 30000 — AB
Special Requests: NORMAL

## 2018-05-14 ENCOUNTER — Other Ambulatory Visit: Payer: Self-pay | Admitting: Obstetrics and Gynecology

## 2018-05-14 DIAGNOSIS — Z3491 Encounter for supervision of normal pregnancy, unspecified, first trimester: Secondary | ICD-10-CM

## 2018-05-15 ENCOUNTER — Ambulatory Visit (INDEPENDENT_AMBULATORY_CARE_PROVIDER_SITE_OTHER): Payer: Managed Care, Other (non HMO)

## 2018-05-15 ENCOUNTER — Ambulatory Visit (INDEPENDENT_AMBULATORY_CARE_PROVIDER_SITE_OTHER): Payer: Managed Care, Other (non HMO) | Admitting: Obstetrics and Gynecology

## 2018-05-15 ENCOUNTER — Encounter: Payer: Self-pay | Admitting: Obstetrics and Gynecology

## 2018-05-15 VITALS — BP 125/72 | Wt 205.0 lb

## 2018-05-15 DIAGNOSIS — Z3A01 Less than 8 weeks gestation of pregnancy: Secondary | ICD-10-CM

## 2018-05-15 DIAGNOSIS — Z3401 Encounter for supervision of normal first pregnancy, first trimester: Secondary | ICD-10-CM

## 2018-05-15 DIAGNOSIS — Z3400 Encounter for supervision of normal first pregnancy, unspecified trimester: Secondary | ICD-10-CM

## 2018-05-15 DIAGNOSIS — Z3491 Encounter for supervision of normal pregnancy, unspecified, first trimester: Secondary | ICD-10-CM

## 2018-05-15 LAB — POCT URINALYSIS DIPSTICK OB
Glucose, UA: NEGATIVE
POC,PROTEIN,UA: NEGATIVE

## 2018-05-15 NOTE — Progress Notes (Signed)
ROB N&V 

## 2018-05-15 NOTE — Progress Notes (Signed)
New Obstetric Patient H&P    Chief Complaint: "Desires prenatal care"   History of Present Illness: Patient is a 22 y.o. G2P0010 Not Hispanic or Latino female, presents with amenorrhea and positive home pregnancy test. Patient's last menstrual period was 03/28/2018 (exact date). and based on her  LMP, her EDD is Estimated Date of Delivery: 01/02/19 and her EGA is 107w0d.  Her last pap smear was 3 months ago and was no abnormalities.   Her last menstrual period was normal. Since her LMP she claims she has experienced nausea, fatigue, breast tenderness. She had had vaginal bleeding with ultrasound showing viable IUP and SCH. Her past medical history is contibutory for depression.   Since her LMP, she admits to the use of tobacco products  no There are cats in the home in the home  no  She admits close contact with children on a regular basis  no  She has had chicken pox in the past yes She has had Tuberculosis exposures, symptoms, or previously tested positive for TB   no Current or past history of domestic violence. no  Genetic Screening/Teratology Counseling: (Includes patient, baby's father, or anyone in either family with:)   1. Patient's age >/= 83 at [facility]  no 2. Thalassemia ([place], [place], Mediterranean, or Asian background): MCV<80  no 3. Neural tube defect (meningomyelocele, spina bifida, anencephaly)  no 4. Congenital heart defect  no  5. Down syndrome  no 6. Tay-Sachs (Jewish, [place]  no 7. Canavan's Disease  no 8. Sickle cell disease or trait (African)  no  9. Hemophilia or other blood disorders  no  10. Muscular dystrophy  no  11. Cystic fibrosis  no  12. Huntington's Chorea  no  13. Mental retardation/autism  Yes female cousin with autism 31. Other inherited genetic or chromosomal disorder  no 15. Maternal metabolic disorder (DM, PKU, etc)  no 16. Patient or FOB with a child with a birth defect not listed above no  16a. Patient or FOB with a birth defect  themselves no 17. Recurrent pregnancy loss, or stillbirth  no  18. Any medications since LMP other than prenatal vitamins (include vitamins, supplements, OTC meds, drugs, alcohol)  no 19. Any other genetic/environmental exposure to discuss  no  Infection History:   1. Lives with someone with TB or TB exposed  no  2. Patient or partner has history of genital herpes  no 3. Rash or viral illness since LMP  no 4. History of STI (GC, CT, HPV, syphilis, HIV)  no 5. History of recent travel :  no  Other pertinent information:  yes     Review of Systems:10 point review of systems negative unless otherwise noted in HPI  Past Medical History:  Past Medical History:  Diagnosis Date  . Anxiety   . Depression     Past Surgical History:  Past Surgical History:  Procedure Laterality Date  . WISDOM TOOTH EXTRACTION     x4 extracted     Gynecologic History: Patient's last menstrual period was 03/28/2018 (exact date).  Obstetric History: G2P0010  Family History:  Family History  Problem Relation Age of Onset  . Breast cancer Paternal Aunt   . Breast cancer Maternal Grandmother     Social History:  Social History   Socioeconomic History  . Marital status: Legally Separated    Spouse name: Not on file  . Number of children: Not on file  . Years of education: Not on file  . Highest education  level: Not on file  Occupational History  . Not on file  Social Needs  . Financial resource strain: Not on file  . Food insecurity:    Worry: Not on file    Inability: Not on file  . Transportation needs:    Medical: Not on file    Non-medical: Not on file  Tobacco Use  . Smoking status: Never Smoker  . Smokeless tobacco: Never Used  Substance and Sexual Activity  . Alcohol use: No  . Drug use: No  . Sexual activity: Not on file  Lifestyle  . Physical activity:    Days per week: Not on file    Minutes per session: Not on file  . Stress: Not on file  Relationships  . Social  connections:    Talks on phone: Not on file    Gets together: Not on file    Attends religious service: Not on file    Active member of club or organization: Not on file    Attends meetings of clubs or organizations: Not on file    Relationship status: Not on file  . Intimate partner violence:    Fear of current or ex partner: Not on file    Emotionally abused: Not on file    Physically abused: Not on file    Forced sexual activity: Not on file  Other Topics Concern  . Not on file  Social History Narrative  . Not on file    Allergies:  Allergies  Allergen Reactions  . Amoxicillin Nausea And Vomiting  . Penicillins Anaphylaxis    Has patient had a PCN reaction causing immediate rash, facial/tongue/throat swelling, SOB or lightheadedness with hypotension: Yes Has patient had a PCN reaction causing severe rash involving mucus membranes or skin necrosis: Unknown Has patient had a PCN reaction that required hospitalization: Unknown Has patient had a PCN reaction occurring within the last 10 years: No If all of the above answers are "NO", then may proceed with Cephalosporin use.   . Eggs Or Egg-Derived Products Hives    Medications: Prior to Admission medications   Medication Sig Start Date End Date Taking? Authorizing Provider  albuterol (PROVENTIL HFA;VENTOLIN HFA) 108 (90 Base) MCG/ACT inhaler Inhale 1-2 puffs into the lungs every 6 (six) hours as needed for wheezing or shortness of breath.   Yes [provider]  buPROPion (WELLBUTRIN XL) 300 MG 24 hr tablet Take 300 mg by mouth daily. 02/20/18   [provider]  busPIRone (BUSPAR) 10 MG tablet Take 10 mg by mouth 2 (two) times daily. 02/11/18   [provider]  fluvoxaMINE (LUVOX) 100 MG tablet Take 1 tablet (100 mg total) by mouth at bedtime. Patient not taking: Reported on 05/15/2018 03/06/18   Pucilowska, Ellin Goodie, MD  hydrOXYzine (ATARAX/VISTARIL) 50 MG tablet Take 1 tablet (50 mg total) by mouth 3  (three) times daily as needed for anxiety. Patient not taking: Reported on 05/15/2018 03/06/18   Pucilowska, Ellin Goodie, MD  nitrofurantoin, macrocrystal-monohydrate, (MACROBID) 100 MG capsule Take 1 capsule (100 mg total) by mouth 2 (two) times daily for 7 days. Patient not taking: Reported on 05/15/2018 05/11/18 05/18/18  Loleta Rose, MD    Physical Exam Vitals: Blood pressure 125/72, weight 205 lb (93 kg), last menstrual period 03/28/2018.  General: NAD HEENT: normocephalic, anicteric Thyroid: no enlargement, no palpable nodules Pulmonary: No increased work of breathing, CTAB Cardiovascular: RRR, distal pulses 2+ Abdomen: NABS, soft, non-tender, non-distended.  Umbilicus without lesions.  No hepatomegaly, splenomegaly  or masses palpable. No evidence of hernia  Genitourinary:  External: Normal external female genitalia.  Normal urethral meatus, normal  Bartholin's and Skene's glands.    Vagina: Normal vaginal mucosa, no evidence of prolapse.    Cervix: Grossly normal in appearance, no bleeding  Uterus:  Non-enlarged, mobile, normal contour.  No CMT  Adnexa: ovaries non-enlarged, no adnexal masses  Rectal: deferred Extremities: no edema, erythema, or tenderness Neurologic: Grossly intact Psychiatric: mood appropriate, affect full  [place] Ob Comp < 14 Wks  Result Date: 05/11/2018 CLINICAL DATA:  Pelvic pain. Estimated gestational age of [redacted] weeks, 2 days by LMP. EXAM: OBSTETRIC <14 WK US AND TRANSVAGINAL OB US TECHNIQUE: Both transabdominal and transvaginal ultrasound examinations were performed for complete evaluation of the gestation as well as the maternal uterus, adnexal regions, and pelvic cul-de-sac. Transvaginal technique was performed to assess early pregnancy. COMPARISON:  None. FINDINGS: Intrauterine gestational sac: Single Yolk sac:  Visualized. Embryo:  Visualized. Cardiac Activity: Visualized. Heart Rate: 123 bpm CRL:  4  mm   6 w   0 d                  US EDC: 01/04/2019  Subchorionic hemorrhage: Small 1.1 x 0.7 x 0.8 cm subchorionic hemorrhage. Maternal uterus/adnexae: Unremarkable.  Left corpus luteum. IMPRESSION: 1. Single, live intrauterine pregnancy with estimated gestational age of [redacted] weeks, 0 days. 2. Small subchorionic hemorrhage. Electronically Signed   By: Obie DredgeWilliam T Derry M.D.   On: 05/11/2018 07:30   [place] Ob Transvaginal  Result Date: 05/16/2018 Patient Name: Brenda FetterCaitlin Marie Gross DOB: July 12, 1995 MRN: [id number] ULTRASOUND REPORT Location: [facility] Date of Service: 05/15/2018 Indications:dating Findings: Mason JimSingleton intrauterine pregnancy is visualized with a CRL consistent with 2745w1d gestation, giving an (U/S) EDD of 12/31/2018. The (U/S) EDD is consistent with the first ultrasound EDD of 01/04/2019. FHR: 147 BPM CRL measurement: 9.8 mm Yolk sac is visualized and appears normal and early anatomy is normal. Amnion: visualized and appears normal No SCH seen on today's ultrasound. Bilateral ovaries have complex cysts. RT measuring 2.4 x 2.2 x 2.2cm and LT measuring 3.6 x 2.7 x 2.8cm. Survey of the adnexa demonstrates no adnexal masses. There is no free peritoneal fluid in the cul de sac. Impression: 1. 845w1d Viable Singleton Intrauterine pregnancy by U/S. 2. (U/S) EDD is consistent with first ultrasound EDD of 01/04/2019. 3. Bilateral complex ovarian cysts L>R. 4. Subchorionic hemorrhage has resolved. Recommendations: 1.Clinical correlation with the patient's History and Physical Exam. Darlina GuysAbby M Clarke, RDMS RVT There is a viable singleton gestation.  The fetal biometry correlates with established dating. Detailed evaluation of the fetal anatomy is precluded by early gestational age.  Bilateral theca lutein cysts are visualized. It must be noted that a normal ultrasound particular at this early gestational age is unable to rule out fetal aneuploidy, risk of first trimester miscarriage, or anatomic birth defects. Vena AustriaAndreas Zariana Strub, MD, Merlinda FrederickFACOG [facility], [facility]   [place] Ob Transvaginal  Result Date: 05/11/2018 CLINICAL DATA:  Pelvic pain. Estimated gestational age of [redacted] weeks, 2 days by LMP. EXAM: OBSTETRIC <14 WK US AND TRANSVAGINAL OB US TECHNIQUE: Both transabdominal and transvaginal ultrasound examinations were performed for complete evaluation of the gestation as well as the maternal uterus, adnexal regions, and pelvic cul-de-sac. Transvaginal technique was performed to assess early pregnancy. COMPARISON:  None. FINDINGS: Intrauterine gestational sac: Single Yolk sac:  Visualized. Embryo:  Visualized. Cardiac Activity: Visualized. Heart Rate: 123 bpm CRL:  4  mm  6 w   0 d                  [place] EDC: 01/04/2019 Subchorionic hemorrhage: Small 1.1 x 0.7 x 0.8 cm subchorionic hemorrhage. Maternal uterus/adnexae: Unremarkable.  Left corpus luteum. IMPRESSION: 1. Single, live intrauterine pregnancy with estimated gestational age of [redacted] weeks, 0 days. 2. Small subchorionic hemorrhage. Electronically Signed   By: Obie Dredge M.D.   On: 05/11/2018 07:30     Assessment: 22 y.o. G2P0010 at [redacted]w[redacted]d presenting to initiate prenatal care  Plan: 1) Avoid alcoholic beverages. 2) Patient encouraged not to smoke.  3) Discontinue the use of all non-medicinal drugs and chemicals.  4) Take prenatal vitamins daily.  5) Nutrition, food safety (fish, cheese advisories, and high nitrite foods) and exercise discussed. 6) Hospital and practice style discussed with cross coverage system.  7) Genetic Screening, such as with 1st Trimester Screening, cell free fetal DNA, AFP testing, and Ultrasound, as well as with amniocentesis and CVS as appropriate, is discussed with patient. At the conclusion of today's visit patient requested genetic testing 8) Patient is asked about travel to areas at risk for the Zika virus, and counseled to avoid travel and exposure to mosquitoes or sexual partners who may have themselves been exposed to the virus. Testing is discussed, and will be  ordered as appropriate.   Vena [place], MD, [facility], [facility]

## 2018-05-16 LAB — RPR+RH+ABO+RUB AB+AB SCR+CB...
Antibody Screen: NEGATIVE
HIV Screen 4th Generation wRfx: NONREACTIVE
Hematocrit: 37.7 % (ref 34.0–46.6)
Hemoglobin: 12.5 g/dL (ref 11.1–15.9)
Hepatitis B Surface Ag: NEGATIVE
MCH: 29.6 pg (ref 26.6–33.0)
MCHC: 33.2 g/dL (ref 31.5–35.7)
MCV: 89 fL (ref 79–97)
Platelets: 306 10*3/uL (ref 150–450)
RBC: 4.22 x10E6/uL (ref 3.77–5.28)
RDW: 13.3 % (ref 12.3–15.4)
RPR Ser Ql: NONREACTIVE
Rh Factor: POSITIVE
Rubella Antibodies, IGG: 4.7 index (ref >0.99)
Varicella zoster IgG: 2267 index (ref >165)
WBC: 8.7 10*3/uL (ref 3.4–10.8)

## 2018-05-24 ENCOUNTER — Ambulatory Visit (INDEPENDENT_AMBULATORY_CARE_PROVIDER_SITE_OTHER): Payer: Managed Care, Other (non HMO) | Admitting: Obstetrics and Gynecology

## 2018-05-24 VITALS — BP 120/60 | Wt 206.0 lb

## 2018-05-24 DIAGNOSIS — O0990 Supervision of high risk pregnancy, unspecified, unspecified trimester: Secondary | ICD-10-CM | POA: Insufficient documentation

## 2018-05-24 DIAGNOSIS — O210 Mild hyperemesis gravidarum: Secondary | ICD-10-CM

## 2018-05-24 DIAGNOSIS — Z3480 Encounter for supervision of other normal pregnancy, unspecified trimester: Secondary | ICD-10-CM

## 2018-05-24 DIAGNOSIS — Z3A08 8 weeks gestation of pregnancy: Secondary | ICD-10-CM

## 2018-05-24 MED ORDER — ONDANSETRON 4 MG PO TBDP: 4.0000 mg | ORAL_TABLET | Freq: Four times a day (QID) | ORAL | 0 refills | Status: DC | PRN

## 2018-05-24 MED ORDER — DOXYLAMINE-PYRIDOXINE 10-10 MG PO TBEC: 2.0000 | DELAYED_RELEASE_TABLET | Freq: Every day | ORAL | 5 refills | Status: DC

## 2018-05-24 NOTE — Progress Notes (Signed)
    Routine Prenatal Care Visit  Subjective  Brenda Hayes is a 22 y.o. G2P0010 at 2880w1d being seen today for ongoing prenatal care.  She is currently monitored for the following issues for this high-risk pregnancy and has Major depressive disorder, recurrent severe without psychotic features (HCC); Self-injurious behavior; Suicide attempt (HCC); OCD (obsessive compulsive disorder); PTSD (post-traumatic stress disorder); and Supervision of other normal pregnancy, antepartum on their problem list.  ----------------------------------------------------------------------------------- Patient reports vomiting.   Contractions: Not present. Vag. Bleeding: None.  Movement: Absent. Denies leaking of fluid.  ----------------------------------------------------------------------------------- The following portions of the patient's history were reviewed and updated as appropriate: allergies, current medications, past family history, past medical history, past social history, past surgical history and problem list. Problem list updated.   Objective  Blood pressure 120/60, weight 206 lb (93.4 kg), last menstrual period 03/28/2018. Pregravid weight 210 lb (95.3 kg) Total Weight Gain -4 lb (-1.814 kg) Urinalysis:      Fetal Status: Fetal Heart Rate (bpm): 175   Movement: Absent     General:  Alert, oriented and cooperative. Patient is in no acute distress.  Skin: Skin is warm and dry. No rash noted.   Cardiovascular: Normal heart rate noted  Respiratory: Normal respiratory effort, no problems with respiration noted  Abdomen: Soft, gravid, appropriate for gestational age. Pain/Pressure: Absent     Pelvic:  Cervical exam deferred        Extremities: Normal range of motion.     ental Status: Normal mood and affect. Normal behavior. Normal judgment and thought content.     Assessment   22 y.o. G2P0010 at 6280w1d by  01/02/2019, by Last Menstrual Period presenting for routine prenatal visit  Plan    pregnancy #2 Problems (from 03/08/18 to present)    Problem Noted Resolved   Supervision of other normal pregnancy, antepartum 05/24/2018 by Vena AustriaStaebler, Damarien Nyman, MD No   Overview Signed 05/24/2018  9:26 AM by Vena AustriaStaebler, Delrick Dehart, MD    Clinic [facility] Prenatal Labs  Dating LMP = 6 week US Blood type: O/Positive/-- (12/11 1643)   Genetic Screen NIPS: SMA:  FragileX:  CF: Antibody:Negative (12/11 1643)  Anatomic US  Rubella: 4.70 (12/11 1643) Varicella: Immune  GTT  RPR: Non Reactive (12/11 1643)   Rhogam none HBsAg: Negative (12/11 1643)   TDaP vaccine                       Flu Shot: HIV: Non Reactive (12/11 1643)   Baby Food                                GBS:   Contraception  GBS prior pregnancy: N/A  Pelvis Tested   Pap: 03/13/18  CS/VBAC N/A   Support Person Jill AlexandersJustin              Gestational age appropriate obstetric precautions including but not limited to vaginal bleeding, contractions, leaking of fluid and fetal movement were reviewed in detail with the patient.    Return in about 1 week (around 05/31/2018) for ROB-Adi Seales.  Vena AustriaAndreas Sadira Standard, MD, Evern CoreFACOG [facility], [facility] 05/24/2018, 9:28 AM

## 2018-05-24 NOTE — Progress Notes (Signed)
ROB N&V 

## 2018-05-31 ENCOUNTER — Ambulatory Visit (INDEPENDENT_AMBULATORY_CARE_PROVIDER_SITE_OTHER): Payer: Managed Care, Other (non HMO) | Admitting: Obstetrics and Gynecology

## 2018-05-31 VITALS — BP 122/80 | Wt 205.0 lb

## 2018-05-31 DIAGNOSIS — O99341 Other mental disorders complicating pregnancy, first trimester: Secondary | ICD-10-CM

## 2018-05-31 DIAGNOSIS — Z3480 Encounter for supervision of other normal pregnancy, unspecified trimester: Secondary | ICD-10-CM

## 2018-05-31 DIAGNOSIS — Z3A08 8 weeks gestation of pregnancy: Secondary | ICD-10-CM

## 2018-05-31 DIAGNOSIS — F332 Major depressive disorder, recurrent severe without psychotic features: Secondary | ICD-10-CM

## 2018-05-31 LAB — POCT URINALYSIS DIPSTICK OB
Glucose, UA: NEGATIVE
POC,PROTEIN,UA: NEGATIVE

## 2018-05-31 MED ORDER — PROCHLORPERAZINE MALEATE 10 MG PO TABS: 10.0000 mg | ORAL_TABLET | Freq: Four times a day (QID) | ORAL | 3 refills | Status: DC | PRN

## 2018-05-31 NOTE — Progress Notes (Signed)
    Routine Prenatal Care Visit  Subjective  Brenda Hayes is a 22 y.o. G2P0010 at 6710w1d being seen today for ongoing prenatal care.  She is currently monitored for the following issues for this high-risk pregnancy and has Major depressive disorder, recurrent severe without psychotic features (HCC); Self-injurious behavior; Suicide attempt (HCC); OCD (obsessive compulsive disorder); PTSD (post-traumatic stress disorder); and Supervision of other normal pregnancy, antepartum on their problem list.  ----------------------------------------------------------------------------------- Patient reports no complaints.  Some occasional menstrual type cramps.  Moods remain stable Contractions: Not present. Vag. Bleeding: None.  Movement: Absent. Denies leaking of fluid.  ----------------------------------------------------------------------------------- The following portions of the patient's history were reviewed and updated as appropriate: allergies, current medications, past family history, past medical history, past social history, past surgical history and problem list. Problem list updated.   Objective  Blood pressure 122/80, weight 205 lb (93 kg), last menstrual period 03/28/2018. Pregravid weight 210 lb (95.3 kg) Total Weight Gain -5 lb (-2.268 kg) Urinalysis:      Fetal Status: Fetal Heart Rate (bpm): 175   Movement: Absent     General:  Alert, oriented and cooperative. Patient is in no acute distress.  Skin: Skin is warm and dry. No rash noted.   Cardiovascular: Normal heart rate noted  Respiratory: Normal respiratory effort, no problems with respiration noted  Abdomen: Soft, gravid, appropriate for gestational age. Pain/Pressure: Present     Pelvic:  Cervical exam deferred        Extremities: Normal range of motion.     ental Status: Normal mood and affect. Normal behavior. Normal judgment and thought content.     Assessment   [age] y.o. G2P0010 at 4410w1d by  01/02/2019, by Last  Menstrual Period presenting for routine prenatal visit  Plan   pregnancy #2 Problems (from 03/08/18 to present)    Problem Noted Resolved   Supervision of other normal pregnancy, antepartum 05/24/2018 by Vena AustriaStaebler, Oluwadamilare Tobler, MD No   Overview Addendum 05/24/2018  9:26 AM by Vena AustriaStaebler, Aamari Strawderman, MD    [facility]inic [facility] Prenatal Labs  Dating LMP = 6 week US Blood type: O/Positive/-- (12/11 1643)   Genetic Screen NIPS: SMA:  FragileX:  CF: Antibody:Negative (12/11 1643)  Anatomic US  Rubella: 4.70 (12/11 1643) Varicella: Immune  GTT  RPR: Non Reactive (12/11 1643)   Rhogam none HBsAg: Negative (12/11 1643)   TDaP vaccine                       Flu Shot: HIV: Non Reactive (12/11 1643)   Baby Food                                GBS:   Contraception  GBS prior pregnancy: N/A  Pelvis Tested   Pap: 03/13/18  CS/VBAC N/A   Support Person Jill AlexandersJustin              Gestational age appropriate obstetric precautions including but not limited to vaginal bleeding, contractions, leaking of fluid and fetal movement were reviewed in detail with the patient.    Return in about 2 weeks (around 06/14/2018) for ROB (Venda Dice).  Vena AustriaAndreas Mckenzi Buonomo, MD, Merlinda FrederickFACOG [facility], [facility] 05/31/2018, 4:32 PM

## 2018-05-31 NOTE — Addendum Note (Signed)
Addended by: Cornelius MorasPATTERSON, Kassondra Geil D on: 05/31/2018 04:40 PM   Modules accepted: Orders

## 2018-05-31 NOTE — Progress Notes (Signed)
routin

## 2018-06-05 NOTE — L&D Delivery Note (Signed)
Delivery Note At 12:48 AM a viable female was delivered via Vaginal, Spontaneous (Presentation: OA).  APGAR: 7, 8; weight 7 lb 1.2 oz (3210 g).   Placenta status: delivered spontaneously, intact.  Cord: 3VC with the following complications: none.  Cord pH: n/a  Anesthesia:  epidural Episiotomy: None Lacerations: 2nd degree;Vaginal Suture Repair: 3.0 vicryl Est. Blood Loss (mL): 400  Mom to postpartum.  Baby to Couplet care / Skin to Skin.  Called to see patient.  Mom pushed to deliver a viable female infant.  The head followed by shoulders, which delivered without difficulty, and the rest of the body.  No nuchal cord noted.  Baby to mom's chest.  Cord clamped and cut after > 1 min delay.  Cord blood obtained.  Placenta delivered spontaneously, intact, with a 3-vessel cord.  Second degree perineal laceration repaired with 3-0 Vicryl in standard fashion.  All counts correct.  Hemostasis obtained with IV pitocin and fundal massage. QBL 400 mL.     Prentice Docker, MD 12/28/2018, 1:21 AM

## 2018-06-07 ENCOUNTER — Telehealth: Payer: Self-pay

## 2018-06-07 ENCOUNTER — Emergency Department
Admission: EM | Admit: 2018-06-07 | Discharge: 2018-06-07 | Disposition: A | Discharge status: Home / Self Care | Payer: Managed Care, Other (non HMO) | Attending: Student in an Organized Health Care Education/Training Program | Admitting: Student in an Organized Health Care Education/Training Program

## 2018-06-07 ENCOUNTER — Other Ambulatory Visit: Payer: Self-pay

## 2018-06-07 ENCOUNTER — Encounter: Payer: Self-pay | Admitting: Emergency Medicine

## 2018-06-07 DIAGNOSIS — O26899 Other specified pregnancy related conditions, unspecified trimester: Secondary | ICD-10-CM | POA: Insufficient documentation

## 2018-06-07 DIAGNOSIS — R102 Pelvic and perineal pain: Secondary | ICD-10-CM | POA: Diagnosis not present

## 2018-06-07 DIAGNOSIS — Z3A10 10 weeks gestation of pregnancy: Secondary | ICD-10-CM | POA: Diagnosis not present

## 2018-06-07 DIAGNOSIS — M545 Low back pain: Secondary | ICD-10-CM | POA: Insufficient documentation

## 2018-06-07 LAB — URINALYSIS, COMPLETE (UACMP) WITH MICROSCOPIC
Bilirubin Urine: NEGATIVE
Glucose, UA: NEGATIVE mg/dL
Hgb urine dipstick: NEGATIVE
Ketones, ur: NEGATIVE mg/dL
Nitrite: NEGATIVE
Protein, ur: NEGATIVE mg/dL
SPECIFIC GRAVITY, URINE: 1.024 (ref 1.005–1.030)
pH: 7 (ref 5.0–8.0)

## 2018-06-07 LAB — CBC
HCT: 36.4 % (ref 36.0–46.0)
Hemoglobin: 12.6 g/dL (ref 12.0–15.0)
MCH: 30.7 pg (ref 26.0–34.0)
MCHC: 34.6 g/dL (ref 30.0–36.0)
MCV: 88.6 fL (ref 80.0–100.0)
Platelets: 277 10*3/uL (ref 150–400)
RBC: 4.11 MIL/uL (ref 3.87–5.11)
RDW: 13.4 % (ref 11.5–15.5)
WBC: 10.2 10*3/uL (ref 4.0–10.5)
nRBC: 0 % (ref 0.0–0.2)

## 2018-06-07 LAB — COMPREHENSIVE METABOLIC PANEL
ALT: 13 U/L (ref 0–44)
AST: 19 U/L (ref 15–41)
Albumin: 3.6 g/dL (ref 3.5–5.0)
Alkaline Phosphatase: 63 U/L (ref 38–126)
Anion gap: 8 (ref 5–15)
BILIRUBIN TOTAL: 0.5 mg/dL (ref 0.3–1.2)
BUN: 8 mg/dL (ref 6–20)
CO2: 20 mmol/L — ABNORMAL LOW (ref 22–32)
Calcium: 8.9 mg/dL (ref 8.9–10.3)
Chloride: 107 mmol/L (ref 98–111)
Creatinine, Ser: 0.47 mg/dL (ref 0.44–1.00)
GFR calc Af Amer: >60 mL/min (ref >60)
GFR calc non Af Amer: >60 mL/min (ref >60)
Glucose, Bld: 89 mg/dL (ref 70–99)
Potassium: 3.7 mmol/L (ref 3.5–5.1)
Sodium: 135 mmol/L (ref 135–145)
TOTAL PROTEIN: 6.9 g/dL (ref 6.5–8.1)

## 2018-06-07 LAB — HCG, QUANTITATIVE, PREGNANCY: hCG, Beta Chain, Quant, S: 125860 m[IU]/mL — ABNORMAL HIGH (ref <5)

## 2018-06-07 MED ORDER — NITROFURANTOIN MONOHYD MACRO 100 MG PO CAPS: 100.0000 mg | ORAL_CAPSULE | Freq: Two times a day (BID) | ORAL | 0 refills | Status: AC

## 2018-06-07 NOTE — ED Notes (Addendum)
Pt c/o mid back pain X 3-4 days and lower abdominal cramping for same amount of time. Pt approx [redacted] weeks pregnant. Denies vaginal bleeding or abnormal discharge at this time. Pt ambulates with ease.

## 2018-06-07 NOTE — Discharge Instructions (Signed)

## 2018-06-07 NOTE — ED Provider Notes (Signed)
[facility] Emergency Department Provider Note    First MD Initiated Contact with Patient 06/07/18 1432     (approximate)  I have reviewed the triage vital signs and the nursing notes.   HISTORY  Chief Complaint Back Pain    HPI Brenda Hayes is a 23 y.o. female roughly [redacted] weeks pregnant by LMP presents the ER for evaluation of crampy low back pain and pelvic pain.  Has had symptoms similar to this over the past several weeks.  Denies any fevers.  No dysuria.  No vaginal discharge or bleeding.  No new medications.  No nausea or vomiting.  Denies any trauma.    Past Medical History:  Diagnosis Date  . Anxiety   . Depression    Family History  Problem Relation Age of Onset  . Breast cancer Paternal Aunt   . Breast cancer Maternal Grandmother    Past Surgical History:  Procedure Laterality Date  . WISDOM TOOTH EXTRACTION     x4 extracted    Patient Active Problem List   Diagnosis Date Noted  . Supervision of other normal pregnancy, antepartum 05/24/2018  . OCD (obsessive compulsive disorder) 03/05/2018  . PTSD (post-traumatic stress disorder) 03/05/2018  . Major depressive disorder, recurrent severe without psychotic features (HCC) 03/04/2018  . Self-injurious behavior 03/04/2018  . Suicide attempt (HCC) 03/04/2018      Prior to Admission medications   Medication Sig Start Date End Date Taking? Authorizing Provider  albuterol (PROVENTIL HFA;VENTOLIN HFA) 108 (90 Base) MCG/ACT inhaler Inhale 1-2 puffs into the lungs every 6 (six) hours as needed for wheezing or shortness of breath.    [provider]  buPROPion (WELLBUTRIN XL) 300 MG 24 hr tablet Take 300 mg by mouth daily. 02/20/18   [provider]  busPIRone (BUSPAR) 10 MG tablet Take 10 mg by mouth 2 (two) times daily. 02/11/18   [provider]  Doxylamine-Pyridoxine (DICLEGIS) 10-10 MG TBEC Take 2 tablets by mouth at bedtime. If symptoms persist, add one  tablet in the morning and one in the afternoon 05/24/18   Vena Austria, MD  fluvoxaMINE (LUVOX) 100 MG tablet Take 1 tablet (100 mg total) by mouth at bedtime. Patient not taking: Reported on 05/24/2018 03/06/18   Pucilowska, Ellin Goodie, MD  hydrOXYzine (ATARAX/VISTARIL) 50 MG tablet Take 1 tablet (50 mg total) by mouth 3 (three) times daily as needed for anxiety. Patient not taking: Reported on 05/24/2018 03/06/18   Pucilowska, Ellin Goodie, MD  nitrofurantoin, macrocrystal-monohydrate, (MACROBID) 100 MG capsule Take 1 capsule (100 mg total) by mouth 2 (two) times daily for 5 days. 06/07/18 06/12/18  Willy Eddy, MD  ondansetron (ZOFRAN ODT) 4 MG disintegrating tablet Take 1 tablet (4 mg total) by mouth every 6 (six) hours as needed for nausea. 05/24/18   Vena Austria, MD  prochlorperazine (COMPAZINE) 10 MG tablet Take 1 tablet (10 mg total) by mouth every 6 (six) hours as needed for nausea or vomiting. 05/31/18   Vena Austria, MD    Allergies Amoxicillin; Penicillins; and Eggs or egg-derived products    Social History Social History   Tobacco Use  . Smoking status: Never Smoker  . Smokeless tobacco: Never Used  Substance Use Topics  . Alcohol use: No  . Drug use: No    Review of Systems Patient denies headaches, rhinorrhea, blurry vision, numbness, shortness of breath, chest pain, edema, cough, abdominal pain, nausea, vomiting, diarrhea, dysuria, fevers, rashes or hallucinations unless otherwise stated above in HPI. ____________________________________________  PHYSICAL EXAM:  VITAL SIGNS: Vitals:   06/07/18 1327  BP: 121/70  Pulse: 100  Resp: 16  Temp: 98.2 F (36.8 C)  SpO2: 97%    Constitutional: Alert and oriented.  Eyes: Conjunctivae are normal.  Head: Atraumatic. Nose: No congestion/rhinnorhea. Mouth/Throat: Mucous membranes are moist.   Neck: No stridor. Painless ROM.  Cardiovascular: Normal rate, regular rhythm. Grossly normal heart sounds.   Good peripheral circulation. Respiratory: Normal respiratory effort.  No retractions. Lungs CTAB. Gastrointestinal: Soft and nontender. No distention. No abdominal bruits. No CVA tenderness. Genitourinary: deferred Musculoskeletal: No lower extremity tenderness nor edema.  No joint effusions. Neurologic:  Normal speech and language. No gross focal neurologic deficits are appreciated. No facial droop Skin:  Skin is warm, dry and intact. No rash noted. Psychiatric: Mood and affect are normal. Speech and behavior are normal.  ____________________________________________   LABS (all labs ordered are listed, but only abnormal results are displayed)  Results for orders placed or performed during the hospital encounter of 06/07/18 (from the past 24 hour(s))  hCG, quantitative, pregnancy     Status: Abnormal   Collection Time: 06/07/18  1:17 PM  Result Value Ref Range   hCG, Beta Chain, Quant, S 125,860 (H) <5 mIU/mL  Comprehensive metabolic panel     Status: Abnormal   Collection Time: 06/07/18  1:38 PM  Result Value Ref Range   Sodium 135 135 - 145 mmol/L   Potassium 3.7 3.5 - 5.1 mmol/L   Chloride 107 98 - 111 mmol/L   CO2 20 (L) 22 - 32 mmol/L   Glucose, Bld 89 70 - 99 mg/dL   BUN 8 6 - 20 mg/dL   Creatinine, Ser 6.80 0.44 - 1.00 mg/dL   Calcium 8.9 8.9 - 32.1 mg/dL   Total Protein 6.9 6.5 - 8.1 g/dL   Albumin 3.6 3.5 - 5.0 g/dL   AST 19 15 - 41 U/L   ALT 13 0 - 44 U/L   Alkaline Phosphatase 63 38 - 126 U/L   Total Bilirubin 0.5 0.3 - 1.2 mg/dL   GFR calc non Af Amer >60 >60 mL/min   GFR calc Af Amer >60 >60 mL/min   Anion gap 8 5 - 15  CBC     Status: None   Collection Time: 06/07/18  1:38 PM  Result Value Ref Range   WBC 10.2 4.0 - 10.5 K/uL   RBC 4.11 3.87 - 5.11 MIL/uL   Hemoglobin 12.6 12.0 - 15.0 g/dL   HCT 22.4 82.5 - 00.3 %   MCV 88.6 80.0 - 100.0 fL   MCH 30.7 26.0 - 34.0 pg   MCHC 34.6 30.0 - 36.0 g/dL   RDW 70.4 88.8 - 91.6 %   Platelets 277 150 - 400 K/uL    nRBC 0.0 0.0 - 0.2 %  Urinalysis, Complete w Microscopic     Status: Abnormal   Collection Time: 06/07/18  2:43 PM  Result Value Ref Range   Color, Urine YELLOW (A) YELLOW   APPearance CLEAR (A) CLEAR   Specific Gravity, Urine 1.024 1.005 - 1.030   pH 7.0 5.0 - 8.0   Glucose, UA NEGATIVE NEGATIVE mg/dL   Hgb urine dipstick NEGATIVE NEGATIVE   Bilirubin Urine NEGATIVE NEGATIVE   Ketones, ur NEGATIVE NEGATIVE mg/dL   Protein, ur NEGATIVE NEGATIVE mg/dL   Nitrite NEGATIVE NEGATIVE   Leukocytes, UA TRACE (A) NEGATIVE   RBC / HPF 11-20 0 - 5 RBC/hpf   WBC, UA 0-5 0 - 5  WBC/hpf   Bacteria, UA RARE (A) NONE SEEN   Squamous Epithelial / LPF 0-5 0 - 5   Mucus PRESENT    ____________________________________________  ____________________________________________  RADIOLOGY  EMERGENCY DEPARTMENT US PREGNANCY "Study: Limited Ultrasound of the Pelvis for Pregnancy"  INDICATIONS:Pregnancy(required) and Abdominal or pelvic pain Multiple views of the uterus and pelvic cavity were obtained in real-time with a multi-frequency probe.  APPROACH:Transabdominal  PERFORMED BY: Myself IMAGES ARCHIVED?: Yes LIMITATIONS: none PREGNANCY FREE FLUID: Present ADNEXAL FINDINGS: GESTATIONAL AGE, ESTIMATE:  FETAL HEART RATE: 165 INTERPRETATION: Fetal heart activity seen and reassuring fetal movement     ____________________________________________   PROCEDURES  Procedure(s) performed:  Procedures    Critical Care performed: no ____________________________________________   INITIAL IMPRESSION / ASSESSMENT AND PLAN / ED COURSE  Pertinent labs & imaging results that were available during my care of the patient were reviewed by me and considered in my medical decision making (see chart for details).   DDX: Miscarriage, round ligament pain, Pilo, stone, UTI, appendicitis  Brenda Hayes is a 23 y.o. who presents to the ED with symptoms as described above.  Patient afebrile  hemodynamically stable.  Her abdominal exam is soft benign.  Ultrasound is reassuring.  Blood work is reassuring.  She does have rare bacteria trace leukocytes will start antibiotics though the patient does not have any significant symptoms to suggest acute cystitis or pyelonephritis.  Clinical Course as of Jun 07 1512  Fri Jun 07, 2018  1458 Sinus reassuring.  Fetal heart tones 165.   [PR]    Clinical Course User Index [PR] Willy Eddyobinson, Bianey Tesoro, MD    Patient was able to tolerate PO and was able to ambulate with a steady gait.   As part of my medical decision making, I reviewed the following data within the electronic MEDICAL RECORD NUMBER Nursing notes reviewed and incorporated, Labs reviewed, notes from prior ED visits.   ____________________________________________   FINAL CLINICAL IMPRESSION(S) / ED DIAGNOSES  Final diagnoses:  Pelvic pain in pregnancy      NEW MEDICATIONS STARTED DURING THIS VISIT:  New Prescriptions   NITROFURANTOIN, MACROCRYSTAL-MONOHYDRATE, (MACROBID) 100 MG CAPSULE    Take 1 capsule (100 mg total) by mouth 2 (two) times daily for 5 days.     Note:  This document was prepared using Dragon voice recognition software and may include unintentional dictation errors.    Willy Eddyobinson, Epimenio Schetter, MD 06/07/18 [phone]

## 2018-06-07 NOTE — ED Triage Notes (Signed)
Says [redacted] week pregnant and has back pain.  Says no bleeding.

## 2018-06-07 NOTE — Telephone Encounter (Signed)
FMLA/DISABILITY form for [employer] filled out, signature obtained and given to TN for procesing.

## 2018-06-13 ENCOUNTER — Emergency Department
Admission: EM | Admit: 2018-06-13 | Discharge: 2018-06-13 | Disposition: A | Discharge status: Home / Self Care | Payer: Managed Care, Other (non HMO) | Attending: Emergency Medicine | Admitting: Emergency Medicine

## 2018-06-13 ENCOUNTER — Emergency Department: Payer: Managed Care, Other (non HMO)

## 2018-06-13 DIAGNOSIS — Z3A11 11 weeks gestation of pregnancy: Secondary | ICD-10-CM | POA: Diagnosis not present

## 2018-06-13 DIAGNOSIS — O9989 Other specified diseases and conditions complicating pregnancy, childbirth and the puerperium: Secondary | ICD-10-CM | POA: Diagnosis not present

## 2018-06-13 DIAGNOSIS — R102 Pelvic and perineal pain: Secondary | ICD-10-CM

## 2018-06-13 DIAGNOSIS — M545 Low back pain, unspecified: Secondary | ICD-10-CM

## 2018-06-13 LAB — CBC
HCT: 35.1 % — ABNORMAL LOW (ref 36.0–46.0)
Hemoglobin: 12.2 g/dL (ref 12.0–15.0)
MCH: 31.4 pg (ref 26.0–34.0)
MCHC: 34.8 g/dL (ref 30.0–36.0)
MCV: 90.2 fL (ref 80.0–100.0)
Platelets: 266 10*3/uL (ref 150–400)
RBC: 3.89 MIL/uL (ref 3.87–5.11)
RDW: 13.4 % (ref 11.5–15.5)
WBC: 7.7 10*3/uL (ref 4.0–10.5)
nRBC: 0 % (ref 0.0–0.2)

## 2018-06-13 LAB — HCG, QUANTITATIVE, PREGNANCY: hCG, Beta Chain, Quant, S: 127974 m[IU]/mL — ABNORMAL HIGH (ref <5)

## 2018-06-13 LAB — BASIC METABOLIC PANEL
Anion gap: 7 (ref 5–15)
BUN: 6 mg/dL (ref 6–20)
CHLORIDE: 105 mmol/L (ref 98–111)
CO2: 23 mmol/L (ref 22–32)
Calcium: 9 mg/dL (ref 8.9–10.3)
Creatinine, Ser: 0.61 mg/dL (ref 0.44–1.00)
GFR calc Af Amer: >60 mL/min (ref >60)
GFR calc non Af Amer: >60 mL/min (ref >60)
Glucose, Bld: 83 mg/dL (ref 70–99)
Potassium: 3.3 mmol/L — ABNORMAL LOW (ref 3.5–5.1)
Sodium: 135 mmol/L (ref 135–145)

## 2018-06-13 LAB — URINALYSIS, COMPLETE (UACMP) WITH MICROSCOPIC
Bacteria, UA: NONE SEEN
Bilirubin Urine: NEGATIVE
GLUCOSE, UA: NEGATIVE mg/dL
Hgb urine dipstick: NEGATIVE
Ketones, ur: NEGATIVE mg/dL
Leukocytes, UA: NEGATIVE
Nitrite: NEGATIVE
PROTEIN: NEGATIVE mg/dL
Specific Gravity, Urine: 1.023 (ref 1.005–1.030)
pH: 6 (ref 5.0–8.0)

## 2018-06-13 LAB — WET PREP, GENITAL
Clue Cells Wet Prep HPF POC: NONE SEEN
Sperm: NONE SEEN
Trich, Wet Prep: NONE SEEN
Yeast Wet Prep HPF POC: NONE SEEN

## 2018-06-13 LAB — PREGNANCY, URINE: Preg Test, Ur: POSITIVE — AB

## 2018-06-13 LAB — CHLAMYDIA/NGC RT PCR (ARMC ONLY)
Chlamydia Tr: NOT DETECTED
N gonorrhoeae: NOT DETECTED

## 2018-06-13 MED ORDER — ACETAMINOPHEN 500 MG PO TABS
1000.0000 mg | ORAL_TABLET | Freq: Once | ORAL | Status: AC
  Administered 2018-06-13: 1000 mg via ORAL
  Filled 2018-06-13: qty 2

## 2018-06-13 NOTE — ED Triage Notes (Signed)
Pt reports diagnosed w/ UTI over a week ago, finished antibiotics and still c/o pain. Reports back, stomach/pelvic pain. Pt is [redacted] weeks pregnant.

## 2018-06-13 NOTE — Discharge Instructions (Addendum)
Take Tylenol for pain in pregnancy; it is a safe medication.  Avoid Motrin, Aleve, ibuprofen or Advil when you are pregnant.  Return to the emergency department if you develop severe pain, lightheadedness or fainting, vaginal bleeding, fever, or any other symptoms concerning to you.

## 2018-06-13 NOTE — ED Notes (Signed)
First Nurse Note: Patient visualized in lobby, no new complaints noted.

## 2018-06-13 NOTE — ED Provider Notes (Signed)
[facility] Emergency Department Provider Note  ____________________________________________  Time seen: Approximately 3:27 PM  I have reviewed the triage vital signs and the nursing notes.   HISTORY  Chief Complaint Recurrent UTI    HPI Brenda Hayes is a 23 y.o. female to P1 A1 approximately [redacted] weeks pregnant, history of PCOS, presenting with pelvic pain and back pain.  The patient reports that she has been having this comfort, which is all the time, for the past several weeks.  She was seen here last week and diagnosed with a UTI; she has completed her course of Macrobid but continues to have symptoms including low right-sided and central pelvic pain and back pain.  She is not having any dysuria or urinary frequency, but does report some increased thickened vaginal discharge.  She reports that she had a pelvic examination with cultures done at 7 weeks of pregnancy which did not show any signs of infection.  She has had several prior ultrasounds which show a normally developing IUP.  She has had nausea and vomiting which is consistent with her hyperemesis, and grossly unchanged.  No lightheadedness or syncope, constipation or diarrhea.  Past Medical History:  Diagnosis Date  . Anxiety   . Depression     Patient Active Problem List   Diagnosis Date Noted  . Supervision of other normal pregnancy, antepartum 05/24/2018  . OCD (obsessive compulsive disorder) 03/05/2018  . PTSD (post-traumatic stress disorder) 03/05/2018  . Major depressive disorder, recurrent severe without psychotic features (HCC) 03/04/2018  . Self-injurious behavior 03/04/2018  . Suicide attempt (HCC) 03/04/2018    Past Surgical History:  Procedure Laterality Date  . WISDOM TOOTH EXTRACTION     x4 extracted     Current Outpatient Rx  . Order #: [id number] Class: Historical Med  . Order #: [id number] Class: Historical Med  . Order #: [id number] Class: Historical Med  . Order #:  [id number] Class: Normal  . Order #: [id number] Class: Print  . Order #: [id number] Class: Print  . Order #: [id number] Class: Normal  . Order #: [id number] Class: Normal    Allergies Amoxicillin; Penicillins; and Eggs or egg-derived products  Family History  Problem Relation Age of Onset  . Breast cancer Paternal Aunt   . Breast cancer Maternal Grandmother     Social History Social History   Tobacco Use  . Smoking status: Never Smoker  . Smokeless tobacco: Never Used  Substance Use Topics  . Alcohol use: No  . Drug use: No    Review of Systems Constitutional: No fever/chills.  No lightheadedness or syncope. Eyes: No visual changes. ENT: No sore throat. No congestion or rhinorrhea. Cardiovascular: Denies chest pain. Denies palpitations. Respiratory: Denies shortness of breath.  No cough. Gastrointestinal: Positive low pelvic suprapubic and right-sided abdominal pain.  Positive chronic unchanged nausea, and vomiting.  No diarrhea.  No constipation. Genitourinary: Negative for dysuria.  Urinary frequency.  Positive change in vaginal discharge.  No vaginal bleeding. Musculoskeletal: Positive for diffuse low back pain. Skin: Negative for rash. Neurological: Negative for headaches. No focal numbness, tingling or weakness.     ____________________________________________   PHYSICAL EXAM:  VITAL SIGNS: ED Triage Vitals [06/13/18 1137]  Enc Vitals Group     BP 124/61     Pulse Rate 81     Resp      Temp 98 F (36.7 C)     Temp Source Oral     SpO2 100 %     Weight 198 lb (89.8 kg)  Height 5\' 2"  (1.575 m)     Head Circumference      Peak Flow      Pain Score      Pain Loc      Pain Edu?      Excl. in GC?     Constitutional: Alert and oriented. Answers questions appropriately. Eyes: Conjunctivae are normal.  EOMI. No scleral icterus. Head: Atraumatic. Nose: No congestion/rhinnorhea. Mouth/Throat: Mucous membranes are moist.  Neck: No stridor.  Supple.  No  meningismus. Cardiovascular: Normal rate, regular rhythm. No murmurs, rubs or gallops.  Respiratory: Normal respiratory effort.  No accessory muscle use or retractions. Lungs CTAB.  No wheezes, rales or ronchi. Gastrointestinal: Overweight.  Soft, and nondistended.  Normal tenderness to palpation in the suprapubic region in the low right lower quadrant/pelvic region.  No guarding or rebound.  No peritoneal signs. Genitourinary: Normal-appearing external genitalia without lesions. Vaginal exam with copious white-yellowish discharge, erythematous cervix, normal vaginal wall tissue. Bimanual exam is positive for diffuse pain with examination, including for CMT, with right-sided adnexal and suprapubic tenderness to palpation, no palpable masses.  Os closed. Musculoskeletal: No LE edema.  Neurologic:  A&Ox3.  Speech is clear.  Face and smile are symmetric.  EOMI.  Moves all extremities well. Skin:  Skin is warm, dry and intact. No rash noted. Psychiatric: Mood and affect are normal. ____________________________________________   LABS (all labs ordered are listed, but only abnormal results are displayed)  Labs Reviewed  WET PREP, GENITAL - Abnormal; Notable for the following components:      Result Value   WBC, Wet Prep HPF POC FEW (*)    All other components within normal limits  CBC - Abnormal; Notable for the following components:   HCT 35.1 (*)    All other components within normal limits  BASIC METABOLIC PANEL - Abnormal; Notable for the following components:   Potassium 3.3 (*)    All other components within normal limits  URINALYSIS, COMPLETE (UACMP) WITH MICROSCOPIC - Abnormal; Notable for the following components:   Color, Urine YELLOW (*)    APPearance CLEAR (*)    All other components within normal limits  HCG, QUANTITATIVE, PREGNANCY - Abnormal; Notable for the following components:   hCG, Beta Chain, Quant, [place] [phone] (*)    All other components within normal limits  PREGNANCY,  URINE - Abnormal; Notable for the following components:   Preg Test, Ur POSITIVE (*)    All other components within normal limits  CHLAMYDIA/NGC RT PCR ([facility] ONLY)  POC URINE PREG, ED   ____________________________________________  EKG  Not indicated ____________________________________________  RADIOLOGY  [place] Ob Comp Less 14 Wks  Result Date: 06/13/2018 CLINICAL DATA:  Pelvic pain. Current assigned gestational age of [redacted] weeks 0 days by prior ultrasound. EXAM: OBSTETRIC <14 WK ULTRASOUND TECHNIQUE: Transabdominal ultrasound was performed for evaluation of the gestation as well as the maternal uterus and adnexal regions. COMPARISON:  05/11/2018 FINDINGS: Intrauterine gestational sac: Single Yolk sac:  Visualized. Embryo:  Visualized. Cardiac Activity: Visualized. Heart Rate: 171 bpm CRL:   47 mm   11 w 3 d                  US EDC: 12/30/2018 Subchorionic hemorrhage:  None visualized. Maternal uterus/adnexae: Small left ovarian corpus luteum cyst noted. Normal appearance of right ovary. No mass or abnormal free fluid identified. IMPRESSION: Current assigned gestational age of [redacted] weeks 0 days. Appropriate fetal growth. No significant maternal uterine or adnexal abnormality identified.  Electronically Signed   By: Myles Rosenthal M.D.   On: 06/13/2018 16:23    ____________________________________________   PROCEDURES  Procedure(s) performed: None  Procedures  Critical Care performed: No ____________________________________________   INITIAL IMPRESSION / ASSESSMENT AND PLAN / ED COURSE  Pertinent labs & imaging results that were available during my care of the patient were reviewed by me and considered in my medical decision making (see chart for details).  24 y.o. G2, P1 A1 approximately Levan weeks pregnant presenting with ongoing pelvic pressure, pain and low back pain with increased vaginal discharge.  The patient reports that she is sexually active, but not very frequently;  she reports that she is in a monogamous relationship and does not think her husband has any extramarital partners.  Her exam is concerning for a fairly large amount of discharge and we will get cultures including gonorrhea and current chlamydia for her.  Her urinalysis does not show any evidence of infection today.  She has a normal white blood cell count and is afebrile and nontoxic.  An ultrasound has also been ordered for evaluation of the uterus and ovaries, especially given her history of PCOS.  Is possible that she is having round ligament pain although this is slightly early in the pregnancy to begin experiencing this.  The patient has not tried anything for her pain so we will give her Tylenol.  Plan reevaluation for final disposition.  ----------------------------------------- 5:15 PM on 06/13/2018 -----------------------------------------  Patient's wet prep is reassuring, and her ultrasound shows an IUP at 11 weeks without any complications.  I am awaiting the results of her gonorrhea and Chlamydia testing.  Plan reevaluation for final disposition.  ----------------------------------------- 5:28 PM on 06/13/2018 -----------------------------------------  Patient's chlamydia and gonorrhea testing are negative.  At this time, plan to discharge the patient home.  I have given her expectant management options, as well as information about round ligament plane.  The patient understands follow-up instructions as well as return precautions.  ____________________________________________  FINAL CLINICAL IMPRESSION(S) / ED DIAGNOSES  Final diagnoses:  Pelvic pain  Acute bilateral low back pain without sciatica         NEW MEDICATIONS STARTED DURING THIS VISIT:  New Prescriptions   No medications on file      Rockne Menghini, MD 06/13/18 1728

## 2018-06-13 NOTE — ED Notes (Signed)
Lab notified to add on hCG level and Urine Preg.

## 2018-06-17 ENCOUNTER — Ambulatory Visit (INDEPENDENT_AMBULATORY_CARE_PROVIDER_SITE_OTHER): Payer: Managed Care, Other (non HMO) | Admitting: Obstetrics and Gynecology

## 2018-06-17 VITALS — BP 124/66 | Wt 205.0 lb

## 2018-06-17 DIAGNOSIS — Z3481 Encounter for supervision of other normal pregnancy, first trimester: Secondary | ICD-10-CM

## 2018-06-17 DIAGNOSIS — Z31438 Encounter for other genetic testing of female for procreative management: Secondary | ICD-10-CM

## 2018-06-17 DIAGNOSIS — Z3A11 11 weeks gestation of pregnancy: Secondary | ICD-10-CM

## 2018-06-17 DIAGNOSIS — Z1379 Encounter for other screening for genetic and chromosomal anomalies: Secondary | ICD-10-CM

## 2018-06-17 DIAGNOSIS — Z3480 Encounter for supervision of other normal pregnancy, unspecified trimester: Secondary | ICD-10-CM

## 2018-06-17 LAB — POCT URINALYSIS DIPSTICK OB
Glucose, UA: NEGATIVE
POC,PROTEIN,UA: NEGATIVE

## 2018-06-17 NOTE — Progress Notes (Signed)
ROB N&V 

## 2018-06-17 NOTE — Progress Notes (Signed)
    Routine Prenatal Care Visit  Subjective  Brenda Hayes is a 23 y.o. G2P0010 at [redacted]w[redacted]d being seen today for ongoing prenatal care.  She is currently monitored for the following issues for this high-risk pregnancy and has Major depressive disorder, recurrent severe without psychotic features (HCC); Self-injurious behavior; Suicide attempt (HCC); OCD (obsessive compulsive disorder); PTSD (post-traumatic stress disorder); and Supervision of other normal pregnancy, antepartum on their problem list.  ----------------------------------------------------------------------------------- Patient reports nausea.   Contractions: Not present. Vag. Bleeding: None.  Movement: Absent. Denies leaking of fluid.  ----------------------------------------------------------------------------------- The following portions of the patient's history were reviewed and updated as appropriate: allergies, current medications, past family history, past medical history, past social history, past surgical history and problem list. Problem list updated.   Objective  Blood pressure 124/66, weight 205 lb (93 kg), last menstrual period 03/28/2018. Pregravid weight 210 lb (95.3 kg) Total Weight Gain -5 lb (-2.268 kg) Urinalysis:      Fetal Status: Fetal Heart Rate (bpm): 159   Movement: Absent     General:  Alert, oriented and cooperative. Patient is in no acute distress.  Skin: Skin is warm and dry. No rash noted.   Cardiovascular: Normal heart rate noted  Respiratory: Normal respiratory effort, no problems with respiration noted  Abdomen: Soft, gravid, appropriate for gestational age. Pain/Pressure: Present     Pelvic:  Cervical exam deferred        Extremities: Normal range of motion.     ental Status: Normal mood and affect. Normal behavior. Normal judgment and thought content.     Assessment   23 y.o. G2P0010 at [redacted]w[redacted]d by  01/02/2019, by Last Menstrual Period presenting for routine prenatal visit  Plan    pregnancy #2 Problems (from 03/08/18 to present)    Problem Noted Resolved   Supervision of other normal pregnancy, antepartum 05/24/2018 by Vena Austria, MD No   Overview Addendum 05/24/2018  9:26 AM by Vena Austria, MD    Clinic [facility] Prenatal Labs  Dating LMP = 6 week [place] Blood type: O/Positive/-- (12/11 1643)   Genetic Screen NIPS: SMA:  FragileX:  CF: Antibody:Negative (12/11 1643)  Anatomic [place]  Rubella: 4.70 (12/11 1643) Varicella: Immune  GTT  RPR: Non Reactive (12/11 1643)   Rhogam none HBsAg: Negative (12/11 1643)   TDaP vaccine                       Flu Shot: HIV: Non Reactive (12/11 1643)   Baby Food                                GBS:   Contraception  GBS prior pregnancy: N/A  Pelvis Tested   Pap: 03/13/18  CS/VBAC N/A   Support Person Jill Alexanders              Gestational age appropriate obstetric precautions including but not limited to vaginal bleeding, contractions, leaking of fluid and fetal movement were reviewed in detail with the patient.    Genetic testing obtained today  Return in about 1 week (around 06/24/2018) for ROB Taniesha Glanz KP.  Vena Austria, MD, [facility], [facility] 06/17/2018, 2:33 PM

## 2018-06-22 LAB — MATERNIT 21 PLUS CORE, BLOOD
CHROMOSOME 13: NEGATIVE
Chromosome 18: NEGATIVE
Chromosome 21: NEGATIVE
Y Chromosome: DETECTED

## 2018-06-25 ENCOUNTER — Ambulatory Visit (INDEPENDENT_AMBULATORY_CARE_PROVIDER_SITE_OTHER): Payer: Managed Care, Other (non HMO) | Admitting: Obstetrics and Gynecology

## 2018-06-25 VITALS — BP 128/56 | Wt 202.0 lb

## 2018-06-25 DIAGNOSIS — Z3A12 12 weeks gestation of pregnancy: Secondary | ICD-10-CM

## 2018-06-25 DIAGNOSIS — T1491XA Suicide attempt, initial encounter: Secondary | ICD-10-CM

## 2018-06-25 DIAGNOSIS — Z3480 Encounter for supervision of other normal pregnancy, unspecified trimester: Secondary | ICD-10-CM

## 2018-06-25 DIAGNOSIS — O99341 Other mental disorders complicating pregnancy, first trimester: Secondary | ICD-10-CM

## 2018-06-25 DIAGNOSIS — F4310 Post-traumatic stress disorder, unspecified: Secondary | ICD-10-CM

## 2018-06-25 LAB — POCT URINALYSIS DIPSTICK OB
Glucose, UA: NEGATIVE
POC,PROTEIN,UA: NEGATIVE

## 2018-06-25 NOTE — Progress Notes (Signed)
    Routine Prenatal Care Visit  Subjective  Brenda Hayes is a 23 y.o. G2P0010 at [redacted]w[redacted]d being seen today for ongoing prenatal care.  She is currently monitored for the following issues for this high-risk pregnancy and has Major depressive disorder, recurrent severe without psychotic features (HCC); Self-injurious behavior; Suicide attempt (HCC); OCD (obsessive compulsive disorder); PTSD (post-traumatic stress disorder); and Supervision of other normal pregnancy, antepartum on their problem list.  ----------------------------------------------------------------------------------- Patient reports URI symptoms and nausea.  Went to urgent care and was started on Tamiflu for influenza.   Contractions: Not present. Vag. Bleeding: None.  Movement: Absent. Denies leaking of fluid.  ----------------------------------------------------------------------------------- The following portions of the patient's history were reviewed and updated as appropriate: allergies, current medications, past family history, past medical history, past social history, past surgical history and problem list. Problem list updated.   Objective  Blood pressure (!) 128/56, weight 202 lb (91.6 kg), last menstrual period 03/28/2018. Pregravid weight 210 lb (95.3 kg) Total Weight Gain -8 lb (-3.629 kg) Urinalysis:      Fetal Status: Fetal Heart Rate (bpm): 163   Movement: Absent     General:  Alert, oriented and cooperative. Patient is in no acute distress.  Skin: Skin is warm and dry. No rash noted.   Cardiovascular: Normal heart rate noted  Respiratory: Normal respiratory effort, no problems with respiration noted  Abdomen: Soft, gravid, appropriate for gestational age. Pain/Pressure: Present     Pelvic:  Cervical exam deferred        Extremities: Normal range of motion.     ental Status: Normal mood and affect. Normal behavior. Normal judgment and thought content.     Assessment   23 y.o. G2P0010 at [redacted]w[redacted]d by   01/02/2019, by Last Menstrual Period presenting for routine prenatal visit  Plan   pregnancy #2 Problems (from 03/08/18 to present)    Problem Noted Resolved   Supervision of other normal pregnancy, antepartum 05/24/2018 by Vena Austria, MD No   Overview Addendum 06/22/2018  2:21 PM by Vena Austria, MD    [facility] Prenatal Labs  Dating LMP = 6 week [place] Blood type: O/Positive/-- (12/11 1643)   Genetic Screen NIPS: Normal XY SMA:  FragileX:  CF: Antibody:Negative (12/11 1643)  Anatomic [place]  Rubella: 4.70 (12/11 1643) Varicella: Immune  GTT  RPR: Non Reactive (12/11 1643)   Rhogam none HBsAg: Negative (12/11 1643)   TDaP vaccine                       Flu Shot: HIV: Non Reactive (12/11 1643)   Baby Food                                GBS:   Contraception  GBS prior pregnancy: N/A  Pelvis Tested   Pap: 03/13/18  CS/VBAC N/A   Support Person Brenda Hayes              Gestational age appropriate obstetric precautions including but not limited to vaginal bleeding, contractions, leaking of fluid and fetal movement were reviewed in detail with the patient.    - currently on tamiflu for positive influenza test  Return in about 1 week (around 07/02/2018) for ROB Brenda Hayes.  Vena Austria, MD, [facility], [facility] 06/25/2018, 12:12 PM

## 2018-06-25 NOTE — Progress Notes (Signed)
ROB ER follow up/ has flu

## 2018-06-26 ENCOUNTER — Other Ambulatory Visit: Payer: Self-pay | Admitting: Obstetrics and Gynecology

## 2018-06-26 MED ORDER — ALBUTEROL SULFATE HFA 108 (90 BASE) MCG/ACT IN AERS: 1.0000 | INHALATION_SPRAY | Freq: Four times a day (QID) | RESPIRATORY_TRACT | 3 refills | Status: AC | PRN

## 2018-06-28 LAB — INHERITEST CORE(CF97,SMA,FRAX)

## 2018-07-03 ENCOUNTER — Ambulatory Visit (INDEPENDENT_AMBULATORY_CARE_PROVIDER_SITE_OTHER): Payer: Managed Care, Other (non HMO) | Admitting: Obstetrics and Gynecology

## 2018-07-03 VITALS — BP 120/80 | Wt 202.0 lb

## 2018-07-03 DIAGNOSIS — Z3481 Encounter for supervision of other normal pregnancy, first trimester: Secondary | ICD-10-CM

## 2018-07-03 DIAGNOSIS — Z3480 Encounter for supervision of other normal pregnancy, unspecified trimester: Secondary | ICD-10-CM

## 2018-07-03 DIAGNOSIS — Z3A13 13 weeks gestation of pregnancy: Secondary | ICD-10-CM

## 2018-07-03 LAB — POCT URINALYSIS DIPSTICK OB
Glucose, UA: NEGATIVE
POC,PROTEIN,UA: NEGATIVE

## 2018-07-03 NOTE — Progress Notes (Signed)
    Routine Prenatal Care Visit  Subjective  Brenda Hayes is a 23 y.o. G2P0010 at 3952w6d being seen today for ongoing prenatal care.  She is currently monitored for the following issues for this high-risk pregnancy and has Major depressive disorder, recurrent severe without psychotic features (HCC); Self-injurious behavior; Suicide attempt (HCC); OCD (obsessive compulsive disorder); PTSD (post-traumatic stress disorder); and Supervision of other normal pregnancy, antepartum on their problem list.  ----------------------------------------------------------------------------------- Patient reports no complaints.   Contractions: Not present. Vag. Bleeding: None.  Movement: Absent. Denies leaking of fluid.  ----------------------------------------------------------------------------------- The following portions of the patient's history were reviewed and updated as appropriate: allergies, current medications, past family history, past medical history, past social history, past surgical history and problem list. Problem list updated.   Objective  Blood pressure 120/80, weight 202 lb (91.6 kg), last menstrual period 03/28/2018. Pregravid weight 210 lb (95.3 kg) Total Weight Gain -8 lb (-3.629 kg) Urinalysis:      Fetal Status: Fetal Heart Rate (bpm): 150   Movement: Absent     General:  Alert, oriented and cooperative. Patient is in no acute distress.  Skin: Skin is warm and dry. No rash noted.   Cardiovascular: Normal heart rate noted  Respiratory: Normal respiratory effort, no problems with respiration noted  Abdomen: Soft, gravid, appropriate for gestational age. Pain/Pressure: Present     Pelvic:  Cervical exam deferred        Extremities: Normal range of motion.     ental Status: Normal mood and affect. Normal behavior. Normal judgment and thought content.     Assessment   23 y.o. G2P0010 at 6652w6d by  01/02/2019, by Last Menstrual Period presenting for routine prenatal  visit  Plan   pregnancy #2 Problems (from 03/08/18 to present)    Problem Noted Resolved   Supervision of other normal pregnancy, antepartum 05/24/2018 by Vena AustriaStaebler, Toia Micale, MD No   Overview Addendum 07/02/2018  8:39 PM by Vena AustriaStaebler, Gretta Samons, MD    [facility] Prenatal Labs  Dating LMP = 6 week US Blood type: O/Positive/-- (12/11 1643)   Genetic Screen NIPS: Normal XY SMA: neg FragileX: neg CF: neg Antibody:Negative (12/11 1643)  Anatomic US  Rubella: 4.70 (12/11 1643) Varicella: Immune  GTT  RPR: Non Reactive (12/11 1643)   Rhogam none HBsAg: Negative (12/11 1643)   TDaP vaccine                       Flu Shot: HIV: Non Reactive (12/11 1643)   Baby Food                                GBS:   Contraception  GBS prior pregnancy: N/A  Pelvis Tested   Pap: 03/13/18  CS/VBAC N/A   Support Person Jill AlexandersJustin              Gestational age appropriate obstetric precautions including but not limited to vaginal bleeding, contractions, leaking of fluid and fetal movement were reviewed in detail with the patient.    - moods stable  Return in about 1 week (around 07/10/2018) for ROB Sonny Anthes.  Vena AustriaAndreas Levaughn Puccinelli, MD, [facility]linda FrederickFACOG [facility], [facility] 07/03/2018, 11:37 AM

## 2018-07-09 ENCOUNTER — Ambulatory Visit (INDEPENDENT_AMBULATORY_CARE_PROVIDER_SITE_OTHER): Payer: Managed Care, Other (non HMO) | Admitting: Obstetrics and Gynecology

## 2018-07-09 VITALS — BP 115/64 | Wt 202.0 lb

## 2018-07-09 DIAGNOSIS — Z3480 Encounter for supervision of other normal pregnancy, unspecified trimester: Secondary | ICD-10-CM

## 2018-07-09 DIAGNOSIS — Z3A14 14 weeks gestation of pregnancy: Secondary | ICD-10-CM

## 2018-07-09 LAB — POCT URINALYSIS DIPSTICK OB
Glucose, UA: NEGATIVE
POC,PROTEIN,UA: NEGATIVE

## 2018-07-09 NOTE — Progress Notes (Signed)
ROB  Pelvic pain 

## 2018-07-09 NOTE — Progress Notes (Signed)
    Routine Prenatal Care Visit  Subjective  Brenda Hayes is a 23 y.o. G2P0010 at 3258w5d being seen today for ongoing prenatal care.  She is currently monitored for the following issues for this high-risk pregnancy and has Major depressive disorder, recurrent severe without psychotic features (HCC); Self-injurious behavior; Suicide attempt (HCC); OCD (obsessive compulsive disorder); PTSD (post-traumatic stress disorder); and Supervision of other normal pregnancy, antepartum on their problem list.  ----------------------------------------------------------------------------------- Patient reports no complaints.   Contractions: Not present. Vag. Bleeding: None.  Movement: Absent. Denies leaking of fluid.  ----------------------------------------------------------------------------------- The following portions of the patient's history were reviewed and updated as appropriate: allergies, current medications, past family history, past medical history, past social history, past surgical history and problem list. Problem list updated.   Objective  Blood pressure 115/64, weight 202 lb (91.6 kg), last menstrual period 03/28/2018. Pregravid weight 210 lb (95.3 kg) Total Weight Gain -8 lb (-3.629 kg) Urinalysis:      Fetal Status: Fetal Heart Rate (bpm): 150   Movement: Absent     General:  Alert, oriented and cooperative. Patient is in no acute distress.  Skin: Skin is warm and dry. No rash noted.   Cardiovascular: Normal heart rate noted  Respiratory: Normal respiratory effort, no problems with respiration noted  Abdomen: Soft, gravid, appropriate for gestational age. Pain/Pressure: Present     Pelvic:  Cervical exam deferred        Extremities: Normal range of motion.     ental Status: Normal mood and affect. Normal behavior. Normal judgment and thought content.     Assessment   23 y.o. G2P0010 at 6258w5d by  01/02/2019, by Last Menstrual Period presenting for routine prenatal  visit  Plan   pregnancy #2 Problems (from 03/08/18 to present)    Problem Noted Resolved   Supervision of other normal pregnancy, antepartum 05/24/2018 by Vena AustriaStaebler, Shyheem Whitham, MD No   Overview Addendum 07/02/2018  8:39 PM by Vena AustriaStaebler, Terelle Dobler, MD    [facility] Prenatal Labs  Dating LMP = 6 week US Blood type: O/Positive/-- (12/11 1643)   Genetic Screen NIPS: Normal XY SMA: neg FragileX: neg CF: neg Antibody:Negative (12/11 1643)  Anatomic US  Rubella: 4.70 (12/11 1643) Varicella: Immune  GTT  RPR: Non Reactive (12/11 1643)   Rhogam none HBsAg: Negative (12/11 1643)   TDaP vaccine                       Flu Shot: HIV: Non Reactive (12/11 1643)   Baby Food                                GBS:   Contraception  GBS prior pregnancy: N/A  Pelvis Tested   Pap: 03/13/18  CS/VBAC N/A   Support Person Jill AlexandersJustin              Gestational age appropriate obstetric precautions including but not limited to vaginal bleeding, contractions, leaking of fluid and fetal movement were reviewed in detail with the patient.    Return in about 1 week (around 07/16/2018) for ROB Nagee Goates.  Vena AustriaAndreas Kyrillos Adams, MD, Evern CoreFACOG [facility], [facility] 07/09/2018, 11:38 AM

## 2018-07-16 ENCOUNTER — Ambulatory Visit (INDEPENDENT_AMBULATORY_CARE_PROVIDER_SITE_OTHER): Payer: Managed Care, Other (non HMO) | Admitting: Obstetrics and Gynecology

## 2018-07-16 VITALS — BP 126/62 | Wt 204.0 lb

## 2018-07-16 DIAGNOSIS — Z3480 Encounter for supervision of other normal pregnancy, unspecified trimester: Secondary | ICD-10-CM

## 2018-07-16 DIAGNOSIS — O99342 Other mental disorders complicating pregnancy, second trimester: Secondary | ICD-10-CM

## 2018-07-16 DIAGNOSIS — T1491XA Suicide attempt, initial encounter: Secondary | ICD-10-CM

## 2018-07-16 DIAGNOSIS — Z3A15 15 weeks gestation of pregnancy: Secondary | ICD-10-CM

## 2018-07-16 DIAGNOSIS — F332 Major depressive disorder, recurrent severe without psychotic features: Secondary | ICD-10-CM

## 2018-07-16 LAB — POCT URINALYSIS DIPSTICK OB
GLUCOSE, UA: NEGATIVE
POC,PROTEIN,UA: NEGATIVE

## 2018-07-16 NOTE — Progress Notes (Signed)
ROB Pain in lower back and pelvis

## 2018-07-16 NOTE — Progress Notes (Signed)
    Routine Prenatal Care Visit  Subjective  Brenda Hayes is a 23 y.o. G2P0010 at [redacted]w[redacted]d being seen today for ongoing prenatal care.  She is currently monitored for the following issues for this high-risk pregnancy and has Major depressive disorder, recurrent severe without psychotic features (HCC); Self-injurious behavior; Suicide attempt (HCC); OCD (obsessive compulsive disorder); PTSD (post-traumatic stress disorder); and Supervision of other normal pregnancy, antepartum on their problem list.  ----------------------------------------------------------------------------------- Patient reports no complaints.   Contractions: Not present. Vag. Bleeding: None.  Movement: Absent. Denies leaking of fluid.  ----------------------------------------------------------------------------------- The following portions of the patient's history were reviewed and updated as appropriate: allergies, current medications, past family history, past medical history, past social history, past surgical history and problem list. Problem list updated.   Objective  Blood pressure 126/62, weight 204 lb (92.5 kg), last menstrual period 03/28/2018. Pregravid weight 210 lb (95.3 kg) Total Weight Gain -6 lb (-2.722 kg) Urinalysis:      Fetal Status: Fetal Heart Rate (bpm): 145   Movement: Absent     General:  Alert, oriented and cooperative. Patient is in no acute distress.  Skin: Skin is warm and dry. No rash noted.   Cardiovascular: Normal heart rate noted  Respiratory: Normal respiratory effort, no problems with respiration noted  Abdomen: Soft, gravid, appropriate for gestational age. Pain/Pressure: Absent     Pelvic:  Cervical exam deferred        Extremities: Normal range of motion.     ental Status: Normal mood and affect. Normal behavior. Normal judgment and thought content.     Assessment   23 y.o. G2P0010 at [redacted]w[redacted]d by  01/02/2019, by Last Menstrual Period presenting for routine prenatal  visit  Plan   pregnancy #2 Problems (from 03/08/18 to present)    Problem Noted Resolved   Supervision of other normal pregnancy, antepartum 05/24/2018 by Vena Austria, MD No   Overview Addendum 07/02/2018  8:39 PM by Vena Austria, MD    [facility]atal Labs  Dating LMP = 6 week [place] Blood type: O/Positive/-- (12/11 1643)   Genetic Screen NIPS: Normal XY SMA: neg FragileX: neg CF: neg Antibody:Negative (12/11 1643)  Anatomic [place]  Rubella: 4.70 (12/11 1643) Varicella: Immune  GTT  RPR: Non Reactive (12/11 1643)   Rhogam none HBsAg: Negative (12/11 1643)   TDaP vaccine                       Flu Shot: HIV: Non Reactive (12/11 1643)   Baby Food                                GBS:   Contraception  GBS prior pregnancy: N/A  Pelvis Tested   Pap: 03/13/18  CS/VBAC N/A   Support Person Jill Alexanders              Gestational age appropriate obstetric precautions including but not limited to vaginal bleeding, contractions, leaking of fluid and fetal movement were reviewed in detail with the patient.    Return in about 1 week (around 07/23/2018) for ROB (Dasani Thurlow).  Vena Austria, MD, [facility], [facility] 07/17/2018, 9:33 PM

## 2018-07-24 ENCOUNTER — Ambulatory Visit (INDEPENDENT_AMBULATORY_CARE_PROVIDER_SITE_OTHER): Payer: Managed Care, Other (non HMO) | Admitting: Obstetrics and Gynecology

## 2018-07-24 VITALS — BP 98/66 | Wt 205.0 lb

## 2018-07-24 DIAGNOSIS — Z3480 Encounter for supervision of other normal pregnancy, unspecified trimester: Secondary | ICD-10-CM

## 2018-07-24 DIAGNOSIS — F4310 Post-traumatic stress disorder, unspecified: Secondary | ICD-10-CM

## 2018-07-24 DIAGNOSIS — Z3A16 16 weeks gestation of pregnancy: Secondary | ICD-10-CM

## 2018-07-24 DIAGNOSIS — Z7289 Other problems related to lifestyle: Secondary | ICD-10-CM

## 2018-07-24 DIAGNOSIS — T1491XA Suicide attempt, initial encounter: Secondary | ICD-10-CM

## 2018-07-24 DIAGNOSIS — F332 Major depressive disorder, recurrent severe without psychotic features: Secondary | ICD-10-CM

## 2018-07-24 DIAGNOSIS — O99342 Other mental disorders complicating pregnancy, second trimester: Secondary | ICD-10-CM

## 2018-07-24 LAB — POCT URINALYSIS DIPSTICK OB
Glucose, UA: NEGATIVE
PROTEIN: NEGATIVE

## 2018-07-24 NOTE — Progress Notes (Signed)
ROB

## 2018-07-24 NOTE — Progress Notes (Signed)
    Routine Prenatal Care Visit  Subjective  Brenda Hayes is a 23 y.o. G2P0010 at [redacted]w[redacted]d being seen today for ongoing prenatal care.  She is currently monitored for the following issues for this high-risk pregnancy and has Major depressive disorder, recurrent severe without psychotic features (HCC); Self-injurious behavior; Suicide attempt (HCC); OCD (obsessive compulsive disorder); PTSD (post-traumatic stress disorder); and Supervision of other normal pregnancy, antepartum on their problem list.  ----------------------------------------------------------------------------------- Patient reports no complaints.  Some lumbago noted in past week. Contractions: Not present. Vag. Bleeding: None.  Movement: Absent. Denies leaking of fluid.  ----------------------------------------------------------------------------------- The following portions of the patient's history were reviewed and updated as appropriate: allergies, current medications, past family history, past medical history, past social history, past surgical history and problem list. Problem list updated.   Objective  Last menstrual period 03/28/2018. Pregravid weight 210 lb (95.3 kg) Total Weight Gain -5 lb (-2.268 kg) Urinalysis:      Fetal Status: Fetal Heart Rate (bpm): 145   Movement: Absent     General:  Alert, oriented and cooperative. Patient is in no acute distress.  Skin: Skin is warm and dry. No rash noted.   Cardiovascular: Normal heart rate noted  Respiratory: Normal respiratory effort, no problems with respiration noted  Abdomen: Soft, gravid, appropriate for gestational age. Pain/Pressure: Absent     Pelvic:  Cervical exam deferred        Extremities: Normal range of motion.     ental Status: Normal mood and affect. Normal behavior. Normal judgment and thought content.     Assessment   23 y.o. G2P0010 at [redacted]w[redacted]d by  01/02/2019, by Last Menstrual Period presenting for routine prenatal visit  Plan    pregnancy #2 Problems (from 03/08/18 to present)    Problem Noted Resolved   Supervision of other normal pregnancy, antepartum 05/24/2018 by Vena Austria, MD No   Overview Addendum 07/02/2018  8:39 PM by Vena Austria, MD    [facility]atal [facility]  Dating LMP = 6 week [place] Blood type: O/Positive/-- (12/11 1643)   Genetic Screen NIPS: Normal XY SMA: neg FragileX: neg CF: neg Antibody:Negative (12/11 1643)  Anatomic [place]  Rubella: 4.70 (12/11 1643) Varicella: Immune  GTT  RPR: Non Reactive (12/11 1643)   Rhogam none HBsAg: Negative (12/11 1643)   TDaP vaccine                       Flu Shot: HIV: Non Reactive (12/11 1643)   Baby Food                                GBS:   Contraception  GBS prior pregnancy: N/A  Pelvis Tested   Pap: 03/13/18  CS/VBAC N/A   Support Person Jill Alexanders              Gestational age appropriate obstetric precautions including but not limited to vaginal bleeding, contractions, leaking of fluid and fetal movement were reviewed in detail with the patient.    Return in about 1 week (around 07/31/2018) for ROB Virgle Arth.  Vena Austria, MD, Evern Core [facility], [facility] 07/24/2018, 2:11 PM

## 2018-08-01 ENCOUNTER — Ambulatory Visit (INDEPENDENT_AMBULATORY_CARE_PROVIDER_SITE_OTHER): Payer: Managed Care, Other (non HMO) | Admitting: Obstetrics and Gynecology

## 2018-08-01 VITALS — BP 123/67 | Wt 211.0 lb

## 2018-08-01 DIAGNOSIS — F332 Major depressive disorder, recurrent severe without psychotic features: Secondary | ICD-10-CM

## 2018-08-01 DIAGNOSIS — O99342 Other mental disorders complicating pregnancy, second trimester: Secondary | ICD-10-CM

## 2018-08-01 DIAGNOSIS — Z3480 Encounter for supervision of other normal pregnancy, unspecified trimester: Secondary | ICD-10-CM

## 2018-08-01 DIAGNOSIS — Z363 Encounter for antenatal screening for malformations: Secondary | ICD-10-CM

## 2018-08-01 DIAGNOSIS — Z3A18 18 weeks gestation of pregnancy: Secondary | ICD-10-CM

## 2018-08-01 LAB — POCT URINALYSIS DIPSTICK OB
Glucose, UA: NEGATIVE
POC,PROTEIN,UA: NEGATIVE

## 2018-08-01 NOTE — Progress Notes (Signed)
    Routine Prenatal Care Visit  Subjective  Brenda Hayes is a 23 y.o. G2P0010 at [redacted]w[redacted]d being seen today for ongoing prenatal care.  She is currently monitored for the following issues for this high-risk pregnancy and has Major depressive disorder, recurrent severe without psychotic features (HCC); Self-injurious behavior; Suicide attempt (HCC); OCD (obsessive compulsive disorder); PTSD (post-traumatic stress disorder); and Supervision of other normal pregnancy, antepartum on their problem list.  ----------------------------------------------------------------------------------- Patient reports no complaints.   Contractions: Not present. Vag. Bleeding: None.  Movement: Absent. Denies leaking of fluid.  ----------------------------------------------------------------------------------- The following portions of the patient's history were reviewed and updated as appropriate: allergies, current medications, past family history, past medical history, past social history, past surgical history and problem list. Problem list updated.   Objective  Blood pressure 123/67, weight 211 lb (95.7 kg), last menstrual period 03/28/2018. Pregravid weight 210 lb (95.3 kg) Total Weight Gain 1 lb (0.454 kg) Urinalysis:      Fetal Status: Fetal Heart Rate (bpm): 139   Movement: Absent     General:  Alert, oriented and cooperative. Patient is in no acute distress.  Skin: Skin is warm and dry. No rash noted.   Cardiovascular: Normal heart rate noted  Respiratory: Normal respiratory effort, no problems with respiration noted  Abdomen: Soft, gravid, appropriate for gestational age. Pain/Pressure: Absent     Pelvic:  Cervical exam deferred        Extremities: Normal range of motion.     ental Status: Normal mood and affect. Normal behavior. Normal judgment and thought content.     Assessment   23 y.o. G2P0010 at [redacted]w[redacted]d by  01/02/2019, by Last Menstrual Period presenting for routine prenatal  visit  Plan   pregnancy #2 Problems (from 03/08/18 to present)    Problem Noted Resolved   Supervision of other normal pregnancy, antepartum 05/24/2018 by Vena Austria, MD No   Overview Addendum 07/02/2018  8:39 PM by Vena Austria, MD    [facility]atal [facility]  Dating LMP = 6 week [place] Blood type: O/Positive/-- (12/11 1643)   Genetic Screen NIPS: Normal XY SMA: neg FragileX: neg CF: neg Antibody:Negative (12/11 1643)  Anatomic [place]  Rubella: 4.70 (12/11 1643) Varicella: Immune  GTT  RPR: Non Reactive (12/11 1643)   Rhogam none HBsAg: Negative (12/11 1643)   TDaP vaccine                       Flu Shot: HIV: Non Reactive (12/11 1643)   Baby Food                                GBS:   Contraception  GBS prior pregnancy: N/A  Pelvis Tested   Pap: 03/13/18  CS/VBAC N/A   Support Person Jill Alexanders              Gestational age appropriate obstetric precautions including but not limited to vaginal bleeding, contractions, leaking of fluid and fetal movement were reviewed in detail with the patient.    Return in about 1 week (around 08/08/2018) for ROB Lahari Suttles and anatomy scan.  Vena Austria, MD, [facility], [facility]

## 2018-08-01 NOTE — Progress Notes (Signed)
ROB

## 2018-08-07 ENCOUNTER — Ambulatory Visit (INDEPENDENT_AMBULATORY_CARE_PROVIDER_SITE_OTHER): Payer: Managed Care, Other (non HMO)

## 2018-08-07 ENCOUNTER — Ambulatory Visit (INDEPENDENT_AMBULATORY_CARE_PROVIDER_SITE_OTHER): Payer: Managed Care, Other (non HMO) | Admitting: Obstetrics and Gynecology

## 2018-08-07 VITALS — BP 121/69 | Wt 208.0 lb

## 2018-08-07 DIAGNOSIS — F332 Major depressive disorder, recurrent severe without psychotic features: Secondary | ICD-10-CM

## 2018-08-07 DIAGNOSIS — Z363 Encounter for antenatal screening for malformations: Secondary | ICD-10-CM

## 2018-08-07 DIAGNOSIS — Z3480 Encounter for supervision of other normal pregnancy, unspecified trimester: Secondary | ICD-10-CM

## 2018-08-07 DIAGNOSIS — Z0489 Encounter for examination and observation for other specified reasons: Secondary | ICD-10-CM

## 2018-08-07 DIAGNOSIS — Z3A18 18 weeks gestation of pregnancy: Secondary | ICD-10-CM

## 2018-08-07 DIAGNOSIS — O99342 Other mental disorders complicating pregnancy, second trimester: Secondary | ICD-10-CM

## 2018-08-07 DIAGNOSIS — IMO0002 Reserved for concepts with insufficient information to code with codable children: Secondary | ICD-10-CM

## 2018-08-07 NOTE — Progress Notes (Signed)
Routine Prenatal Care Visit  Subjective  Brenda Hayes is a 23 y.o. G2P0010 at [redacted]w[redacted]d being seen today for ongoing prenatal care.  She is currently monitored for the following issues for this high-risk pregnancy and has Major depressive disorder, recurrent severe without psychotic features (HCC); Self-injurious behavior; Suicide attempt (HCC); OCD (obsessive compulsive disorder); PTSD (post-traumatic stress disorder); and Supervision of other normal pregnancy, antepartum on their problem list.  ----------------------------------------------------------------------------------- Patient reports URI symptoms cough, congestion.  No fevers, no chills.  Daughter was sick recently with same symptoms.  No influenza exposures.  Negative travel history Contractions: Not present. Vag. Bleeding: None.  Movement: Present. Denies leaking of fluid.  ----------------------------------------------------------------------------------- The following portions of the patient's history were reviewed and updated as appropriate: allergies, current medications, past family history, past medical history, past social history, past surgical history and problem list. Problem list updated.   Objective  Blood pressure 121/69, weight 208 lb (94.3 kg), last menstrual period 03/28/2018. Pregravid weight 210 lb (95.3 kg) Total Weight Gain -2 lb (-0.907 kg) Urinalysis:      Fetal Status: Fetal Heart Rate (bpm): 153   Movement: Present     General:  Alert, oriented and cooperative. Patient is in no acute distress.  Skin: Skin is warm and dry. No rash noted.   Cardiovascular: Normal heart rate noted  Respiratory: Normal respiratory effort, no problems with respiration noted  Abdomen: Soft, gravid, appropriate for gestational age. Pain/Pressure: Absent     Pelvic:  Cervical exam deferred        Extremities: Normal range of motion.     ental Status: Normal mood and affect. Normal behavior. Normal judgment and thought  content.   US Ob Comp + 14 Wk  Result Date: 08/07/2018 Patient Name: Brenda Hayes DOB: 09-Apr-1996 MRN: [id number] ULTRASOUND REPORT Location: [facility] Date of Service: 08/07/2018 Indications:Anatomy Ultrasound Findings: Mason Jim intrauterine pregnancy is visualized with FHR at 153 BPM. Biometrics give an (U/S) Gestational age of [redacted]w[redacted]d and an (U/S) EDD of 01/03/19; this correlates with the clinically established Estimated Date of Delivery: 01/02/19 Fetal presentation is transverse. EFW: 256g (9oz). Placenta: fundal. Grade: 0. Cervix: 3.7cm. AFI: subjectively normal. Anatomic survey is incomplete for cardiac views, spine and nose/lips due to early gestationage and maternal body habitus; Gender - female.  Right Ovary is not seen. Left Ovary is normal appearance. Survey of the adnexa demonstrates no adnexal masses. There is no free peritoneal fluid in the cul de sac. Impression: 1. [redacted]w[redacted]d Viable Singleton Intrauterine pregnancy by U/S. 2. (U/S) EDD is consistent with Clinically established Estimated Date of Delivery: 01/02/19 . 3. Follow up cardiac views, spine and nose/lips Recommendations: 1.Clinical correlation with the patient's History and Physical Exam. Darlina Guys, RDMS RVT  There is a singleton gestation with subjectively normal amniotic fluid volume. The fetal biometry correlates with established dating. Detailed evaluation of the fetal anatomy was performed.The fetal anatomical survey appears within normal limits within the resolution of ultrasound as described above. Cardiac views, spine, nose and lips are incomplete.   It must be noted that a normal ultrasound is unable to rule out fetal aneuploidy.  Vena Austria, MD, [facility], [facility] 08/07/2018, 9:25 AM   Immunization History  Administered Date(s) Administered  . Tdap 03/04/2018     Assessment   22 y.o. G2P0010 at [redacted]w[redacted]d by  01/02/2019, by Last Menstrual Period presenting for routine prenatal  visit  Plan   pregnancy #2 Problems (from 03/08/18 to present)  Problem Noted Resolved   Supervision of other normal pregnancy, antepartum 05/24/2018 by Vena Austria, MD No   Overview Addendum 07/02/2018  8:39 PM by Vena Austria, MD    Clinic [facility] Prenatal Labs  Dating LMP = 6 week [place] Blood type: O/Positive/-- (12/11 1643)   Genetic Screen NIPS: Normal XY SMA: neg FragileX: neg CF: neg Antibody:Negative (12/11 1643)  Anatomic [place]  Rubella: 4.70 (12/11 1643) Varicella: Immune  GTT  RPR: Non Reactive (12/11 1643)   Rhogam none HBsAg: Negative (12/11 1643)   TDaP vaccine                       Flu Shot: HIV: Non Reactive (12/11 1643)   Baby Food                                GBS:   Contraception  GBS prior pregnancy: N/A  Pelvis Tested   Pap: 03/13/18  CS/VBAC N/A   Support Person Jill Alexanders              Gestational age appropriate obstetric precautions including but not limited to vaginal bleeding, contractions, leaking of fluid and fetal movement were reviewed in detail with the patient.    1) URI symptoms - onset of symptoms 4 days ago  - if no improvement in URI symptoms Rx azithromycin  2) Incomplete anatomy scan - anatomy scan incomplete cardiac views, nose/lips, spine - follow up anatomy scan ordered]  3) Depression  - stable, discussed should symptoms return or worsen low threshold for starting pharmacotherapy  Return in about 4 weeks (around 09/04/2018) for ROB.  Vena Austria, MD, [facility], [facility] 08/07/2018, 9:45 AM

## 2018-08-07 NOTE — Progress Notes (Signed)
ROB Anatomy scan/ It is a BOY!!!! Sore throat/congested

## 2018-08-08 ENCOUNTER — Other Ambulatory Visit: Payer: Self-pay | Admitting: Obstetrics and Gynecology

## 2018-08-08 MED ORDER — AZITHROMYCIN 250 MG PO TABS: ORAL_TABLET | ORAL | 1 refills | Status: DC

## 2018-08-13 ENCOUNTER — Ambulatory Visit (INDEPENDENT_AMBULATORY_CARE_PROVIDER_SITE_OTHER): Payer: Managed Care, Other (non HMO) | Admitting: Advanced Practice Midwife

## 2018-08-13 ENCOUNTER — Ambulatory Visit (INDEPENDENT_AMBULATORY_CARE_PROVIDER_SITE_OTHER): Payer: Managed Care, Other (non HMO)

## 2018-08-13 ENCOUNTER — Encounter: Payer: Self-pay | Admitting: Advanced Practice Midwife

## 2018-08-13 VITALS — BP 110/52 | Wt 213.0 lb

## 2018-08-13 DIAGNOSIS — Z362 Encounter for other antenatal screening follow-up: Secondary | ICD-10-CM | POA: Diagnosis not present

## 2018-08-13 DIAGNOSIS — Z0489 Encounter for examination and observation for other specified reasons: Secondary | ICD-10-CM

## 2018-08-13 DIAGNOSIS — IMO0002 Reserved for concepts with insufficient information to code with codable children: Secondary | ICD-10-CM

## 2018-08-13 DIAGNOSIS — Z3480 Encounter for supervision of other normal pregnancy, unspecified trimester: Secondary | ICD-10-CM

## 2018-08-13 DIAGNOSIS — Z3A19 19 weeks gestation of pregnancy: Secondary | ICD-10-CM

## 2018-08-13 LAB — POCT URINALYSIS DIPSTICK OB
Glucose, UA: NEGATIVE
POC,PROTEIN,UA: NEGATIVE

## 2018-08-13 NOTE — Progress Notes (Signed)
Routine Prenatal Care Visit  Subjective  Brenda Hayes is a 23 y.o. G2P0010 at [redacted]w[redacted]d being seen today for ongoing prenatal care.  She is currently monitored for the following issues for this high-risk pregnancy and has Major depressive disorder, recurrent severe without psychotic features (HCC); Self-injurious behavior; Suicide attempt (HCC); OCD (obsessive compulsive disorder); PTSD (post-traumatic stress disorder); and Supervision of other normal pregnancy, antepartum on their problem list.  ----------------------------------------------------------------------------------- Patient reports still having congestion and difficulty hearing in her left ear. She is taking the prescribed antibiotics. Reviewed other comfort measures and OTC medications. She is having an increase in wetness and thinks it could be urine or vaginal discharge. Advised to wear a pad and see if it is soaked at the end of the day if concerned about amniotic fluid.  She says she has not been feeling the baby yet.   Contractions: Not present. Vag. Bleeding: None.  Movement: Absent. Denies leaking of fluid.  ----------------------------------------------------------------------------------- The following portions of the patient's history were reviewed and updated as appropriate: allergies, current medications, past family history, past medical history, past social history, past surgical history and problem list. Problem list updated.   Objective  Blood pressure (!) 110/52, weight 213 lb (96.6 kg), last menstrual period 03/28/2018. Pregravid weight 210 lb (95.3 kg) Total Weight Gain 3 lb (1.361 kg) Urinalysis: Urine Protein Negative  Urine Glucose Negative  Fetal Status: Fetal Heart Rate (bpm): 147   Movement: Absent     Follow up anatomy scan is now complete for spine, cardiac views and nose/lips  General:  Alert, oriented and cooperative. Patient is in no acute distress.  Skin: Skin is warm and dry. No rash noted.     Cardiovascular: Normal heart rate noted  Respiratory: Normal respiratory effort, no problems with respiration noted  Abdomen: Soft, gravid, appropriate for gestational age. Pain/Pressure: Absent     Pelvic:  Cervical exam deferred        Extremities: Normal range of motion.     Mental Status: Normal mood and affect. Normal behavior. Normal judgment and thought content.   Assessment   23 y.o. G2P0010 at [redacted]w[redacted]d by  01/02/2019, by Last Menstrual Period presenting for routine prenatal visit  Plan   pregnancy #2 Problems (from 03/08/18 to present)    Problem Noted Resolved   Supervision of other normal pregnancy, antepartum 05/24/2018 by Vena Austria, MD No   Overview Addendum 08/07/2018  9:43 AM by Vena Austria, MD    Clinic [facility] Prenatal Labs  Dating LMP = 6 week [place] Blood type: O/Positive/-- (12/11 1643)   Genetic Screen NIPS: Normal XY SMA: neg FragileX: neg CF: neg Antibody:Negative (12/11 1643)  Anatomic [place] Incomplete cardiac views, 3VC, nose/lips, 3VC Rubella: 4.70 (12/11 1643) Varicella: Immune  GTT  RPR: Non Reactive (12/11 1643)   Rhogam none HBsAg: Negative (12/11 1643)   TDaP vaccine                       Flu Shot: HIV: Non Reactive (12/11 1643)   Baby Food                                GBS:   Contraception  GBS prior pregnancy: N/A  Pelvis Tested   Pap: 03/13/18  CS/VBAC N/A   Support Person Justin              Preterm labor symptoms and general obstetric precautions including  but not limited to vaginal bleeding, contractions, leaking of fluid and fetal movement were reviewed in detail with the patient.   Return in about 4 weeks (around 09/10/2018) for rob.  Tresea Mall, CNM 08/13/2018 2:19 PM

## 2018-08-13 NOTE — Progress Notes (Signed)
ROB Earache Leaking clear fluid

## 2018-09-02 NOTE — Telephone Encounter (Signed)
Patient is calling to see if she can get these labs drawn today. Patient is wanting the orders release to a [facility] place in [place]. Please advise

## 2018-09-04 ENCOUNTER — Ambulatory Visit (INDEPENDENT_AMBULATORY_CARE_PROVIDER_SITE_OTHER): Payer: Medicaid Other | Admitting: Obstetrics and Gynecology

## 2018-09-04 ENCOUNTER — Other Ambulatory Visit: Payer: Self-pay

## 2018-09-04 VITALS — BP 122/75 | HR 90 | Wt 216.0 lb

## 2018-09-04 DIAGNOSIS — Z3480 Encounter for supervision of other normal pregnancy, unspecified trimester: Secondary | ICD-10-CM

## 2018-09-04 DIAGNOSIS — Z113 Encounter for screening for infections with a predominantly sexual mode of transmission: Secondary | ICD-10-CM

## 2018-09-04 DIAGNOSIS — F332 Major depressive disorder, recurrent severe without psychotic features: Secondary | ICD-10-CM

## 2018-09-04 DIAGNOSIS — O99342 Other mental disorders complicating pregnancy, second trimester: Secondary | ICD-10-CM

## 2018-09-04 DIAGNOSIS — O09292 Supervision of pregnancy with other poor reproductive or obstetric history, second trimester: Secondary | ICD-10-CM

## 2018-09-04 DIAGNOSIS — Z3A22 22 weeks gestation of pregnancy: Secondary | ICD-10-CM

## 2018-09-04 LAB — POCT URINALYSIS DIPSTICK OB
Glucose, UA: NEGATIVE
POC,PROTEIN,UA: NEGATIVE

## 2018-09-04 NOTE — Progress Notes (Signed)
    Routine Prenatal Care Visit  Subjective  Brenda Hayes is a 23 y.o. G2P0010 at [redacted]w[redacted]d being seen today for ongoing prenatal care.  She is currently monitored for the following issues for this high-risk pregnancy and has Major depressive disorder, recurrent severe without psychotic features (HCC); Self-injurious behavior; Suicide attempt (HCC); OCD (obsessive compulsive disorder); PTSD (post-traumatic stress disorder); and Supervision of other normal pregnancy, antepartum on their problem list.  ----------------------------------------------------------------------------------- Patient reports rash on hands, desires STI testing because of rash.   Contractions: Not present. Vag. Bleeding: None.  Movement: Present. Denies leaking of fluid.  ----------------------------------------------------------------------------------- The following portions of the patient's history were reviewed and updated as appropriate: allergies, current medications, past family history, past medical history, past social history, past surgical history and problem list. Problem list updated.   Objective  Blood pressure 122/75, pulse 90, weight 216 lb (98 kg), last menstrual period 03/28/2018. Pregravid weight 210 lb (95.3 kg) Total Weight Gain 6 lb (2.722 kg) Urinalysis:      Fetal Status: Fetal Heart Rate (bpm): 140   Movement: Present     General:  Alert, oriented and cooperative. Patient is in no acute distress.  Skin: Skin is warm and dry. No rash noted.   Cardiovascular: Normal heart rate noted  Respiratory: Normal respiratory effort, no problems with respiration noted  Abdomen: Soft, gravid, appropriate for gestational age. Pain/Pressure: Absent     Pelvic:  Cervical exam deferred        Extremities: Normal range of motion.     ental Status: Normal mood and affect. Normal behavior. Normal judgment and thought content.     Assessment   23 y.o. G2P0010 at [redacted]w[redacted]d by  01/02/2019, by Last Menstrual  Period presenting for routine prenatal visit  Plan   pregnancy #2 Problems (from 03/08/18 to present)    Problem Noted Resolved   Supervision of other normal pregnancy, antepartum 05/24/2018 by Vena Austria, MD No   Overview Addendum 08/07/2018  9:43 AM by Vena Austria, MD    Clinic [facility] Prenatal Labs  Dating LMP = 6 week [place] Blood type: O/Positive/-- (12/11 1643)   Genetic Screen NIPS: Normal XY SMA: neg FragileX: neg CF: neg Antibody:Negative (12/11 1643)  Anatomic [place] Incomplete cardiac views, 3VC, nose/lips, 3VC completed at follow up 3/10 Rubella: 4.70 (12/11 1643) Varicella: Immune  GTT  RPR: Non Reactive (12/11 1643)   Rhogam none HBsAg: Negative (12/11 1643)   TDaP vaccine                       Flu Shot: HIV: Non Reactive (12/11 1643)   Baby Food                                GBS:   Contraception  GBS prior pregnancy: N/A  Pelvis Tested   Pap: 03/13/18  CS/VBAC N/A   Support Person Jill Alexanders              Gestational age appropriate obstetric precautions including but not limited to vaginal bleeding, contractions, leaking of fluid and fetal movement were reviewed in detail with the patient.    Return in about 6 weeks (around 10/16/2018) for ROB and 28 week labs.  Vena Austria, MD, [facility], [facility] 09/04/2018, 8:35 AM

## 2018-09-04 NOTE — Patient Instructions (Signed)
Hello,  Given the current COVID-19 pandemic, our practice is making changes in how we are providing care to our patients. We are limiting in-person visits for the safety of all of our patients.   As a practice, we have met to discuss the best way to minimize visits, but still provide excellent care to our expecting mothers.  We have decided on the following visit structure for low-risk pregnancies.  Initial Pregnancy visit will be conducted as a telephone or web visit.  Between 10-14 weeks  there will be one in-person visit for an ultrasound, lab work, and genetic screening. 20 weeks in-person visit with an anatomy ultrasound  28 weeks in-person office visit for a 1-hour glucose test and a TDAP vaccination 32 weeks in-person office visit 34 weeks telephone visit 36 weeks in-person office visit for GBS, chlamydia, and gonorrhea testing 38 weeks in-person office visit 40 weeks in-person office visit  Understandably, some patients will require more visits than what is outlined above. Additional visits will be determined on a case-by-case basis.   We will, as always, be available for emergencies or to address concerns that might arise between in-person visits. We ask that you allow [place] the opportunity to address any concerns over the phone or through a virtual visit first. We will be available to return your phone calls throughout the day.   If you are able to purchase a scale, a blood pressure machine, and a home fetal doppler visits could be limited further. This will help decrease your exposure risks, but these purchases are not a necessity.   Things seem to change daily and there is the possibility that this structure could change, please be patient as we adapt to a new way of caring for patients.   Thank you for trusting [place] with your prenatal care. Our practice values you and looks forward to providing you with excellent care.   Sincerely,   [facility], [place]  COVID-19 and Your Pregnancy FAQ  How can I prevent infection with COVID-19 during my pregnancy? Social distancing is key. Please limit any interactions in public. Try and work from home if possible. Frequently wash your hands after touching possibly contaminated surfaces. Avoid touching your face.  Minimize trips to the store. Consider online ordering when possible.   Should I wear a mask? Masks should only be worn by those experiencing symptoms of COVID-19 or those with confirmed COVID-19 when they are in public or around other individuals.  What are the symptoms of COVID-19? Fever (greater than 100.4 F), dry cough, shortness of breath.  Am I more at risk for COVID-19 since I am pregnant? There is not currently data showing that pregnant women are more adversely impacted by COVID-19 than the general population. However, we know that pregnant women tend to have worse respiratory complications from similar diseases such as the flu and SARS and for this reason should be considered an at-risk population.  What do I do if I am experiencing the symptoms of COVID-19? Testing is being limited because of test availability. If you are experiencing symptoms you should quarantine yourself, and the members of your family, for at least 2 weeks at home.   Please visit this website for more information: [email or web address]  When should I go to the Emergency Room? Please go to the emergency room if you are experiencing ANY of these symptoms*:  1.    Difficulty breathing or shortness of breath 2.    Persistent pain or  pressure in the chest 3.    Confusion or difficulty being aroused (or awakened) 4.    Bluish lips or face  *This list is not all inclusive. Please consult our office for any other symptoms that are severe or concerning.  What do I do if I am having difficulty breathing? You should go to the Emergency Room for  evaluation. At this time they have a tent set up for evaluating patients with COVID-19 symptoms.   How will my prenatal care be different because of the COVID-19 pandemic? It has been recommended to reduce the frequency of face-to-face visits and use resources such as telephone and virtual visits when possible. Using a scale, blood pressure machine and fetal doppler at home can further help reduce face-to-face visits. You will be provided with additional information on this topic.  We ask that you come to your visits alone to minimize potential exposures to  COVID-19.  How can I receive childbirth education? At this time in-person classes have been cancelled. You can register for online childbirth education, breastfeeding, and newborn care classes.  Please visit:  CyberComps.hu for more information  How will my hospital birth experience be different? The hospital is currently limiting visitors. This means that while you are in labor you can only have one person at the hospital with you. Additional family members will not be allowed to wait in the building or outside your room. Your one support person can be the father of the baby, a relative, a doula, or a friend. Once one support person is designated that person will wear a band. This band cannot be shared with multiple people.  How long will I stay in the hospital for after giving birth? It is also recommended that discharge home be expedited during the COVID-19 outbreak. This means staying for 1 day after a vaginal delivery and 2 days after a cesarean section.  What if I have COVID-19 and I am in labor? We ask that you wear a mask while on labor and delivery. We will try and accommodate you being placed in a room that is capable of filtering the air. Please call ahead if you are in labor and on your way to the hospital. The phone number for labor and delivery at [facility] is [phone].  If I have  COVID-19 when my baby is born how can I prevent my baby from contracting COVID-19? This is an issue that will have to be discussed on a case-by-case basis. Current recommendations suggest providing separate isolation rooms for both the mother and new infant as well as limiting visitors. However, there are practical challenges to this recommendation. The situation will assuredly change and decisions will be influenced by the desires of the mother and availability of space.  Some suggestions are the use of a curtain or physical barrier between mom and infant, hand hygiene, mom wearing a mask, or 6 feet of spacing between a mom and infant.   Can I breastfeed during the COVID-19 pandemic?   Yes, breastfeeding is encouraged.  Can I breastfeed if I have COVID-19? Yes. Covid-19 has not been found in breast milk. This means you cannot give COVID-19 to your child through breast milk. Breast feeding will also help pass antibodies to fight infection to your baby.   What precautions should I take when breastfeeding if I have COVID-19? If a mother and newborn do room-in and the mother wishes to feed at the breast, she should put on a  facemask and practice hand hygiene before each feeding.  What precautions should I take when pumping if I have COVID-19? Prior to expressing breast milk, mothers should practice hand hygiene. After each pumping session, all parts that come into contact with breast milk should be thoroughly washed and the entire pump should be appropriately disinfected per the manufacturer's instructions. This expressed breast milk should be fed to the newborn by a healthy caregiver.  What if I am pregnant and work in healthcare? Based on limited data regarding COVID-19 and pregnancy, ACOG currently does not propose creating additional restrictions on pregnant health care personnel because of COVID-19 alone. Pregnant women do not appear to be at higher risk of severe disease related to COVID-19.  Pregnant health care personnel should follow CDC risk assessment and infection control guidelines for health care personnel exposed to patients with suspected or confirmed COVID-19. Adherence to recommended infection prevention and control practices is an important part of protecting all health care personnel in health care settings.    Information on COVID-19 in pregnancy is very limited; however, facilities may want to consider limiting exposure of pregnant health care personnel to patients with confirmed or suspected COVID-19 infection, especially during higher-risk procedures (eg, aerosol-generating procedures), if feasible, based on staffing availability.

## 2018-09-04 NOTE — Progress Notes (Signed)
ROB Rash on hands

## 2018-09-05 LAB — HEP, RPR, HIV PANEL
HIV Screen 4th Generation wRfx: NONREACTIVE
Hepatitis B Surface Ag: NEGATIVE
RPR Ser Ql: NONREACTIVE

## 2018-09-05 NOTE — Telephone Encounter (Signed)
Pt aware blood work is negative.

## 2018-09-06 MED ORDER — TRIAMCINOLONE ACETONIDE 0.1 % EX CREA: 1.0000 "application " | TOPICAL_CREAM | Freq: Two times a day (BID) | CUTANEOUS | 1 refills | Status: DC

## 2018-09-06 NOTE — Telephone Encounter (Signed)
PAtient called regarding her rash. Rash is maculopapular on hands and wrists. Had similar outbreak about 1 year ago. Has  Been washing hands more frequently and using hand sanitizer when out due to Covid 19. Using a hand cream and pretroleum on her hands with no relief.  Possible eczema/ contact dermatitis. Will RX Kenalog 0.1% cream to apply BID.  All labs from 4/1 were negative. Farrel Conners, CNM

## 2018-09-06 NOTE — Telephone Encounter (Signed)
FYI Brenda Hayes  

## 2018-09-08 ENCOUNTER — Other Ambulatory Visit: Payer: Self-pay | Admitting: Obstetrics and Gynecology

## 2018-09-12 ENCOUNTER — Other Ambulatory Visit: Payer: Self-pay | Admitting: Obstetrics and Gynecology

## 2018-09-12 MED ORDER — SERTRALINE HCL 50 MG PO TABS: 50.0000 mg | ORAL_TABLET | Freq: Every day | ORAL | 1 refills | Status: DC

## 2018-10-07 ENCOUNTER — Telehealth: Payer: Self-pay

## 2018-10-07 NOTE — Telephone Encounter (Signed)
Pt reports not feeling baby move for the past 2 days & she is having some pain. OI#[phone]

## 2018-10-07 NOTE — Telephone Encounter (Signed)
Spoke to pt. She is having pain in her pelvic area (where the opening is) feels like something is stretching her. Denies vaginal bleeding. Does have some discharge (normal vaginal d/c but increased from her normal). States stomach gets super tight a few times a day & she has pain with that. She is also experiencing low back pain. Offered to work in w/[facility] today. Pt just moved to [place] (45 min away) her car is broke down & her husband is at work in [place]. She will call to see if he is able to get off work & contact us back. Advised that she will likely need to go to ED->L&D for eval given the time span it will take her to get to our office.

## 2018-10-07 NOTE — Telephone Encounter (Signed)
Pt called back to advise her husband is unable to get off work. Pt advised will need to report thru ED to be evaluated on L&D ASAP.

## 2018-10-16 ENCOUNTER — Ambulatory Visit (INDEPENDENT_AMBULATORY_CARE_PROVIDER_SITE_OTHER): Payer: Medicaid Other | Admitting: Obstetrics and Gynecology

## 2018-10-16 ENCOUNTER — Other Ambulatory Visit: Payer: Self-pay

## 2018-10-16 ENCOUNTER — Other Ambulatory Visit: Payer: Medicaid Other

## 2018-10-16 ENCOUNTER — Encounter: Payer: Managed Care, Other (non HMO) | Admitting: Obstetrics and Gynecology

## 2018-10-16 VITALS — BP 116/64 | Wt 227.0 lb

## 2018-10-16 DIAGNOSIS — F332 Major depressive disorder, recurrent severe without psychotic features: Secondary | ICD-10-CM

## 2018-10-16 DIAGNOSIS — Z3480 Encounter for supervision of other normal pregnancy, unspecified trimester: Secondary | ICD-10-CM

## 2018-10-16 DIAGNOSIS — Z23 Encounter for immunization: Secondary | ICD-10-CM | POA: Diagnosis not present

## 2018-10-16 DIAGNOSIS — O99213 Obesity complicating pregnancy, third trimester: Secondary | ICD-10-CM

## 2018-10-16 DIAGNOSIS — Z3A28 28 weeks gestation of pregnancy: Secondary | ICD-10-CM

## 2018-10-16 DIAGNOSIS — O99210 Obesity complicating pregnancy, unspecified trimester: Secondary | ICD-10-CM

## 2018-10-16 LAB — POCT URINALYSIS DIPSTICK OB
Glucose, UA: NEGATIVE
POC,PROTEIN,UA: NEGATIVE

## 2018-10-16 NOTE — Progress Notes (Signed)
ROB 28 week labs  TDAP given   

## 2018-10-16 NOTE — Progress Notes (Signed)
    Routine Prenatal Care Visit  Subjective  Brenda Hayes is a 23 y.o. G2P0010 at [redacted]w[redacted]d being seen today for ongoing prenatal care.  She is currently monitored for the following issues for this high-risk pregnancy and has Major depressive disorder, recurrent severe without psychotic features (HCC); Self-injurious behavior; Suicide attempt (HCC); OCD (obsessive compulsive disorder); PTSD (post-traumatic stress disorder); Supervision of other normal pregnancy, antepartum; and Obesity affecting pregnancy, antepartum on their problem list.  ----------------------------------------------------------------------------------- Patient reports no complaints.   Contractions: Not present. Vag. Bleeding: None.  Movement: Present. Denies leaking of fluid.  ----------------------------------------------------------------------------------- The following portions of the patient's history were reviewed and updated as appropriate: allergies, current medications, past family history, past medical history, past social history, past surgical history and problem list. Problem list updated.   Objective  Blood pressure 116/64, weight 227 lb (103 kg), last menstrual period 03/28/2018. Pregravid weight 210 lb (95.3 kg) Total Weight Gain 17 lb (7.711 kg)  Body mass index is 41.52 kg/m.  Urinalysis:      Fetal Status: Fetal Heart Rate (bpm): 140 Fundal Height: 30 cm Movement: Present     General:  Alert, oriented and cooperative. Patient is in no acute distress.  Skin: Skin is warm and dry. No rash noted.   Cardiovascular: Normal heart rate noted  Respiratory: Normal respiratory effort, no problems with respiration noted  Abdomen: Soft, gravid, appropriate for gestational age. Pain/Pressure: Absent     Pelvic:  Cervical exam deferred        Extremities: Normal range of motion.     ental Status: Normal mood and affect. Normal behavior. Normal judgment and thought content.     Assessment   23 y.o.  G2P0010 at [redacted]w[redacted]d by  01/02/2019, by Last Menstrual Period presenting for routine prenatal visit  Plan   pregnancy #2 Problems (from 03/08/18 to present)    Problem Noted Resolved   Supervision of other normal pregnancy, antepartum 05/24/2018 by Vena Austria, MD No   Overview Addendum 09/04/2018  8:35 AM by Vena Austria, MD    [facility]inic [facility] Prenatal Labs  Dating LMP = 6 week [place] Blood type: O/Positive/-- (12/11 1643)   Genetic Screen NIPS: Normal XY SMA: neg FragileX: neg CF: neg Antibody:Negative (12/11 1643)  Anatomic [place] Incomplete cardiac views, 3VC, nose/lips, 3VC.  Completed on 3/10 follow up Rubella: 4.70 (12/11 1643) Varicella: Immune  GTT  RPR: Non Reactive (12/11 1643)   Rhogam none HBsAg: Negative (12/11 1643)   TDaP vaccine 10/16/2018  HIV: Non Reactive (12/11 1643)   Baby Food                                GBS:   Contraception  GBS prior pregnancy: N/A  Pelvis Tested   Pap: 03/13/18  CS/VBAC N/A   Support Person Jill Alexanders              Gestational age appropriate obstetric precautions including but not limited to vaginal bleeding, contractions, leaking of fluid and fetal movement were reviewed in detail with the patient.    -28 week labs today  Return in about 2 weeks (around 10/30/2018) for 2 weeks ROB phone, 4 weeks ROB growth scan in office.  Vena Austria, MD, [facility], [facility] 10/16/2018, 9:41 AM

## 2018-10-17 ENCOUNTER — Other Ambulatory Visit: Payer: Self-pay | Admitting: Obstetrics and Gynecology

## 2018-10-17 ENCOUNTER — Telehealth: Payer: Self-pay

## 2018-10-17 LAB — 28 WEEK RH+PANEL
Basophils Absolute: 0.1 10*3/uL (ref 0.0–0.2)
Basos: 1 %
EOS (ABSOLUTE): 0.2 10*3/uL (ref 0.0–0.4)
Eos: 2 %
Gestational Diabetes Screen: 74 mg/dL (ref 65–139)
HIV Screen 4th Generation wRfx: NONREACTIVE
Hematocrit: 31 % — ABNORMAL LOW (ref 34.0–46.6)
Hemoglobin: 10.8 g/dL — ABNORMAL LOW (ref 11.1–15.9)
Immature Grans (Abs): 0 10*3/uL (ref 0.0–0.1)
Immature Granulocytes: 0 %
Lymphocytes Absolute: 1.3 10*3/uL (ref 0.7–3.1)
Lymphs: 13 %
MCH: 31.4 pg (ref 26.6–33.0)
MCHC: 34.8 g/dL (ref 31.5–35.7)
MCV: 90 fL (ref 79–97)
Monocytes Absolute: 0.7 10*3/uL (ref 0.1–0.9)
Monocytes: 7 %
Neutrophils Absolute: 7.9 10*3/uL — ABNORMAL HIGH (ref 1.4–7.0)
Neutrophils: 77 %
Platelets: 266 10*3/uL (ref 150–450)
RBC: 3.44 x10E6/uL — ABNORMAL LOW (ref 3.77–5.28)
RDW: 12.8 % (ref 11.7–15.4)
RPR Ser Ql: NONREACTIVE
WBC: 10.2 10*3/uL (ref 3.4–10.8)

## 2018-10-17 MED ORDER — FERROUS SULFATE 325 (65 FE) MG PO TABS: 325.0000 mg | ORAL_TABLET | Freq: Every day | ORAL | 1 refills | Status: AC

## 2018-10-17 NOTE — Telephone Encounter (Signed)
Pt calling for her GTT results from yesterday.   AMS, looks like she passed her GTT, but other values are abnormal. Please call pt, thanks. [phone]

## 2018-10-30 ENCOUNTER — Other Ambulatory Visit: Payer: Self-pay

## 2018-10-30 ENCOUNTER — Ambulatory Visit (INDEPENDENT_AMBULATORY_CARE_PROVIDER_SITE_OTHER): Payer: Medicaid Other | Admitting: Obstetrics and Gynecology

## 2018-10-30 DIAGNOSIS — O99213 Obesity complicating pregnancy, third trimester: Secondary | ICD-10-CM | POA: Diagnosis not present

## 2018-10-30 DIAGNOSIS — O99343 Other mental disorders complicating pregnancy, third trimester: Secondary | ICD-10-CM

## 2018-10-30 DIAGNOSIS — Z3A30 30 weeks gestation of pregnancy: Secondary | ICD-10-CM

## 2018-10-30 DIAGNOSIS — Z3480 Encounter for supervision of other normal pregnancy, unspecified trimester: Secondary | ICD-10-CM

## 2018-10-30 DIAGNOSIS — O99210 Obesity complicating pregnancy, unspecified trimester: Secondary | ICD-10-CM

## 2018-10-30 DIAGNOSIS — F4310 Post-traumatic stress disorder, unspecified: Secondary | ICD-10-CM

## 2018-10-30 NOTE — Progress Notes (Signed)
   I connected with Brenda Hayes on 10/30/18 at  9:10 AM EDT by telephone and verified that I am speaking with the correct person using two identifiers.   I discussed the limitations, risks, security and privacy concerns of performing an evaluation and management service by telephone and the availability of in person appointments. I also discussed with the patient that there may be a patient responsible charge related to this service. The patient expressed understanding and agreed to proceed.  The patient was at home I spoke with the patient from my workstation phoneThe names of people involved in this encounter were: [name] , and Vena Austria   Routine Prenatal Care Visit  Subjective  Brenda Hayes is a 23 y.o. G2P0010 at [redacted]w[redacted]d being seen today for ongoing prenatal care.  She is currently monitored for the following issues for this low-risk pregnancy and has Major depressive disorder, recurrent severe without psychotic features (HCC); Self-injurious behavior; Suicide attempt (HCC); OCD (obsessive compulsive disorder); PTSD (post-traumatic stress disorder); Supervision of other normal pregnancy, antepartum; and Obesity affecting pregnancy, antepartum on their problem list.  ----------------------------------------------------------------------------------- Patient reports no complaints.  Was admitted to [facility] in past 2 weeks for kidney stone Contractions: Not present. Vag. Bleeding: None.  Movement: Present. Denies leaking of fluid.  ----------------------------------------------------------------------------------- The following portions of the patient's history were reviewed and updated as appropriate: allergies, current medications, past family history, past medical history, past social history, past surgical history and problem list. Problem list updated.   Objective  Last menstrual period 03/28/2018. Pregravid weight 210 lb (95.3 kg) Total Weight Gain  17 lb (7.711 kg) Urinalysis:      Fetal Status:     Movement: Present     No physical exam as this was a remote telephone visit to promote social distancing during the current COVID-19 Pandemic    Assessment   23 y.o. G2P0010 at [redacted]w[redacted]d by  01/02/2019, by Last Menstrual Period presenting for routine prenatal visit  Plan   pregnancy #2 Problems (from 03/08/18 to present)    Problem Noted Resolved   Supervision of other normal pregnancy, antepartum 05/24/2018 by Vena Austria, MD No   Overview Addendum 10/17/2018  1:11 PM by Vena Austria, MD    [facility]inic [facility] Prenatal Labs  Dating LMP = 6 week [place] Blood type: O/Positive/-- (12/11 1643)   Genetic Screen NIPS: Normal XY SMA: neg FragileX: neg CF: neg Antibody:Negative (12/11 1643)  Anatomic [place] Incomplete cardiac views, 3VC, nose/lips, 3VC.  Completed on 3/10 follow up Rubella: 4.70 (12/11 1643) Varicella: Immune  GTT 74 RPR: Non Reactive (12/11 1643)   Rhogam none HBsAg: Negative (12/11 1643)   TDaP vaccine 10/16/2018 HIV: Non Reactive (12/11 1643)   Baby Food                                GBS:   Contraception  GBS prior pregnancy: N/A  Pelvis Tested   Pap: 03/13/18  CS/VBAC N/A   Support Person Jill Alexanders              Gestational age appropriate obstetric precautions including but not limited to vaginal bleeding, contractions, leaking of fluid and fetal movement were reviewed in detail with the patient.    Telephone time 10 minutes 2 seconds  Return in about 2 weeks (around 11/13/2018) for ROB.  Vena Austria, MD, Evern Core [facility], [facility] 10/30/2018, 9:18 AM

## 2018-10-30 NOTE — Progress Notes (Signed)
ROB televisit 

## 2018-11-01 NOTE — Telephone Encounter (Signed)
Please have her go to triage at her nearest L&D if she continues to bleed today or begins to have contractions.

## 2018-11-20 ENCOUNTER — Ambulatory Visit (INDEPENDENT_AMBULATORY_CARE_PROVIDER_SITE_OTHER): Payer: Medicaid Other | Admitting: Obstetrics and Gynecology

## 2018-11-20 ENCOUNTER — Ambulatory Visit (INDEPENDENT_AMBULATORY_CARE_PROVIDER_SITE_OTHER): Payer: Medicaid Other

## 2018-11-20 ENCOUNTER — Other Ambulatory Visit: Payer: Self-pay

## 2018-11-20 VITALS — BP 120/78 | Wt 233.0 lb

## 2018-11-20 DIAGNOSIS — Z3A33 33 weeks gestation of pregnancy: Secondary | ICD-10-CM

## 2018-11-20 DIAGNOSIS — Z3480 Encounter for supervision of other normal pregnancy, unspecified trimester: Secondary | ICD-10-CM

## 2018-11-20 DIAGNOSIS — Z362 Encounter for other antenatal screening follow-up: Secondary | ICD-10-CM

## 2018-11-20 DIAGNOSIS — O99210 Obesity complicating pregnancy, unspecified trimester: Secondary | ICD-10-CM

## 2018-11-20 DIAGNOSIS — O99213 Obesity complicating pregnancy, third trimester: Secondary | ICD-10-CM

## 2018-11-20 NOTE — Progress Notes (Signed)
Routine Prenatal Care Visit  Subjective  Brenda FetterCaitlin Marie Hayes is a 23 y.o. G2P0010 at 3171w6d being seen today for ongoing prenatal care.  She is currently monitored for the following issues for this high-risk pregnancy and has Major depressive disorder, recurrent severe without psychotic features (HCC); Self-injurious behavior; Suicide attempt (HCC); OCD (obsessive compulsive disorder); PTSD (post-traumatic stress disorder); Supervision of other normal pregnancy, antepartum; and Obesity affecting pregnancy, antepartum on their problem list.  ----------------------------------------------------------------------------------- Patient reports irregular contractions.    .  .  Movement: Present. Denies leaking of fluid.  ----------------------------------------------------------------------------------- The following portions of the patient's history were reviewed and updated as appropriate: allergies, current medications, past family history, past medical history, past social history, past surgical history and problem list. Problem list updated.   Objective  Blood pressure 120/78, weight 233 lb (105.7 kg), last menstrual period 03/28/2018. Pregravid weight 210 lb (95.3 kg) Total Weight Gain 23 lb (10.4 kg)  Body mass index is 42.62 kg/m.  Urinalysis:      Fetal Status: Fetal Heart Rate (bpm): 150   Movement: Present     General:  Alert, oriented and cooperative. Patient is in no acute distress.  Skin: Skin is warm and dry. No rash noted.   Cardiovascular: Normal heart rate noted  Respiratory: Normal respiratory effort, no problems with respiration noted  Abdomen: Soft, gravid, appropriate for gestational age. Pain/Pressure: Absent     Pelvic:  Cervical exam deferred        Extremities: Normal range of motion.     ental Status: Normal mood and affect. Normal behavior. Normal judgment and thought content.   [place] Ob Follow Up  Result Date: 11/20/2018 Patient Name: Brenda Hayes  DOB: May 11, 1996 MRN: [id number] ULTRASOUND REPORT Location: [facility] Date of Service: 11/20/2018 Indications:growth/afi Findings: Mason JimSingleton intrauterine pregnancy is visualized with FHR at 140 BPM. Biometrics give an (U/S) Gestational age of 2837w2d and an (U/S) EDD of 12/30/18; this correlates with the clinically established Estimated Date of Delivery: 01/02/19. Fetal presentation is Cephalic. Placenta: posterior. Grade: 2 AFI: 10.6 cm Growth percentile is 44.0 (HC measuring 2 weeks ahead). EFW: 2,281g (5lbs 0oz) Impression: 1. 6871w6d Viable Singleton Intrauterine pregnancy previously established criteria. 2. Growth is 44.0 %ile.  AFI is 10.6 cm. Recommendations: 1.Clinical correlation with the patient's History and Physical Exam. Darlina GuysAbby M Clarke, RT There is a singleton gestation with normal amniotic fluid volume. The fetal biometry correlates with established dating.  Limited fetal anatomy was performed.The visualized fetal anatomical survey appears within normal limits within the resolution of ultrasound as described above.  It must be noted that a normal ultrasound is unable to rule out fetal aneuploidy.  Vena AustriaAndreas Letcher Schweikert, MD, Evern CoreFACOG [facility], [facility] 11/20/2018, 9:09 AM   Assessment   23 y.o. G2P0010 at 7771w6d by  01/02/2019, by Last Menstrual Period presenting for routine prenatal visit  Plan   pregnancy #2 Problems (from 03/08/18 to present)    Problem Noted Resolved   Supervision of other normal pregnancy, antepartum 05/24/2018 by Vena AustriaStaebler, Corene Resnick, MD No   Overview Addendum 10/17/2018  1:11 PM by Vena AustriaStaebler, Sarah Zerby, MD    [facility]inic [facility] Prenatal Labs  Dating LMP = 6 week US Blood type: O/Positive/-- (12/11 1643)   Genetic Screen NIPS: Normal XY SMA: neg FragileX: neg CF: neg Antibody:Negative (12/11 1643)  Anatomic US Incomplete cardiac views, 3VC, nose/lips, 3VC.  Completed on 3/10 follow up Rubella: 4.70 (12/11 1643) Varicella: Immune  GTT 74 RPR: Non Reactive (12/11  1643)   Rhogam none HBsAg: Negative (12/11 1643)   TDaP vaccine 10/16/2018 HIV: Non Reactive (12/11 1643)   Baby Food                                GBS:   Contraception  GBS prior pregnancy: N/A  Pelvis Tested   Pap: 03/13/18  CS/VBAC N/A   Support Person Larkin Ina              Gestational age appropriate obstetric precautions including but not limited to vaginal bleeding, contractions, leaking of fluid and fetal movement were reviewed in detail with the patient.    Return in about 2 weeks (around 12/04/2018) for ROB.  Malachy Mood, MD, [facility], [facility] 11/20/2018, 9:24 AM

## 2018-11-20 NOTE — Progress Notes (Signed)
ROB/US C/o braxton hicks Denies lof, no vb, Good FM

## 2018-12-04 ENCOUNTER — Other Ambulatory Visit: Payer: Self-pay

## 2018-12-04 ENCOUNTER — Ambulatory Visit (INDEPENDENT_AMBULATORY_CARE_PROVIDER_SITE_OTHER): Payer: Medicaid Other | Admitting: Obstetrics and Gynecology

## 2018-12-04 VITALS — BP 130/80 | Wt 235.0 lb

## 2018-12-04 DIAGNOSIS — Z3480 Encounter for supervision of other normal pregnancy, unspecified trimester: Secondary | ICD-10-CM

## 2018-12-04 DIAGNOSIS — Z3A35 35 weeks gestation of pregnancy: Secondary | ICD-10-CM

## 2018-12-04 DIAGNOSIS — O99343 Other mental disorders complicating pregnancy, third trimester: Secondary | ICD-10-CM

## 2018-12-04 DIAGNOSIS — O99210 Obesity complicating pregnancy, unspecified trimester: Secondary | ICD-10-CM

## 2018-12-04 DIAGNOSIS — O99213 Obesity complicating pregnancy, third trimester: Secondary | ICD-10-CM

## 2018-12-04 DIAGNOSIS — F332 Major depressive disorder, recurrent severe without psychotic features: Secondary | ICD-10-CM

## 2018-12-04 LAB — POCT URINALYSIS DIPSTICK OB
Glucose, UA: NEGATIVE
POC,PROTEIN,UA: NEGATIVE

## 2018-12-04 NOTE — Progress Notes (Signed)
    Routine Prenatal Care Visit  Subjective  Brenda Hayes is a 23 y.o. G2P0010 at [redacted]w[redacted]d being seen today for ongoing prenatal care.  She is currently monitored for the following issues for this high-risk pregnancy and has Major depressive disorder, recurrent severe without psychotic features ([place]); Self-injurious behavior; Suicide attempt ([place]); OCD (obsessive compulsive disorder); PTSD (post-traumatic stress disorder); Supervision of other normal pregnancy, antepartum; and Obesity affecting pregnancy, antepartum on their problem list.  ----------------------------------------------------------------------------------- Patient reports no complaints.   Contractions: Not present. Vag. Bleeding: None.  Movement: Present. Denies leaking of fluid.  ----------------------------------------------------------------------------------- The following portions of the patient's history were reviewed and updated as appropriate: allergies, current medications, past family history, past medical history, past social history, past surgical history and problem list. Problem list updated.   Objective  Blood pressure 130/80, weight 235 lb (106.6 kg), last menstrual period 03/28/2018. Pregravid weight 210 lb (95.3 kg) Total Weight Gain 25 lb (11.3 kg)  Body mass index is 42.98 kg/m.  Urinalysis:      Fetal Status: Fetal Heart Rate (bpm): 145 Fundal Height: 36 cm Movement: Present     General:  Alert, oriented and cooperative. Patient is in no acute distress.  Skin: Skin is warm and dry. No rash noted.   Cardiovascular: Normal heart rate noted  Respiratory: Normal respiratory effort, no problems with respiration noted  Abdomen: Soft, gravid, appropriate for gestational age. Pain/Pressure: Present     Pelvic:  Cervical exam deferred        Extremities: Normal range of motion.     ental Status: Normal mood and affect. Normal behavior. Normal judgment and thought content.     Assessment   23 y.o.  G2P0010 at [redacted]w[redacted]d by  01/02/2019, by Last Menstrual Period presenting for routine prenatal visit  Plan   pregnancy #2 Problems (from 03/08/18 to present)    Problem Noted Resolved   Supervision of other normal pregnancy, antepartum 05/24/2018 by Malachy Mood, MD No   Overview Addendum 10/17/2018  1:11 PM by Malachy Mood, MD    [facility]inic [facility] Prenatal Labs  Dating LMP = 6 week [place] Blood type: O/Positive/-- (12/11 1643)   Genetic Screen NIPS: Normal XY SMA: neg FragileX: neg CF: neg Antibody:Negative (12/11 1643)  Anatomic [place] Incomplete cardiac views, 3VC, nose/lips, 3VC.  Completed on 3/10 follow up Rubella: 4.70 (12/11 1643) Varicella: Immune  GTT 74 RPR: Non Reactive (12/11 1643)   Rhogam none HBsAg: Negative (12/11 1643)   TDaP vaccine 10/16/2018 HIV: Non Reactive (12/11 1643)   Baby Food                                GBS:   Contraception  GBS prior pregnancy: N/A  Pelvis Tested   Pap: 03/13/18  CS/VBAC N/A   Support Person Larkin Ina              Gestational age appropriate obstetric precautions including but not limited to vaginal bleeding, contractions, leaking of fluid and fetal movement were reviewed in detail with the patient.    - GBS aptima next visit - discussed arrive study  Return in about 1 week (around 12/11/2018) for ROB 1 week, ROB 2 week (Voris Tigert).  Malachy Mood, MD, [facility], [facility] 12/04/2018, 11:33 AM

## 2018-12-04 NOTE — Addendum Note (Signed)
Addended by: Quintella Baton D on: 12/04/2018 11:41 AM   Modules accepted: Orders

## 2018-12-12 ENCOUNTER — Other Ambulatory Visit: Payer: Self-pay

## 2018-12-12 ENCOUNTER — Ambulatory Visit (INDEPENDENT_AMBULATORY_CARE_PROVIDER_SITE_OTHER): Payer: Medicaid Other | Admitting: Certified Nurse Midwife

## 2018-12-12 ENCOUNTER — Other Ambulatory Visit (HOSPITAL_COMMUNITY)
Admission: RE | Admit: 2018-12-12 | Discharge: 2018-12-12 | Disposition: A | Discharge status: Home / Self Care | Payer: Medicaid Other | Source: Ambulatory Visit | Attending: Certified Nurse Midwife | Admitting: Certified Nurse Midwife

## 2018-12-12 VITALS — BP 116/70 | Wt 235.0 lb

## 2018-12-12 DIAGNOSIS — O99213 Obesity complicating pregnancy, third trimester: Secondary | ICD-10-CM

## 2018-12-12 DIAGNOSIS — O0993 Supervision of high risk pregnancy, unspecified, third trimester: Secondary | ICD-10-CM

## 2018-12-12 DIAGNOSIS — O0990 Supervision of high risk pregnancy, unspecified, unspecified trimester: Secondary | ICD-10-CM | POA: Diagnosis present

## 2018-12-12 DIAGNOSIS — Z113 Encounter for screening for infections with a predominantly sexual mode of transmission: Secondary | ICD-10-CM

## 2018-12-12 DIAGNOSIS — O99210 Obesity complicating pregnancy, unspecified trimester: Secondary | ICD-10-CM

## 2018-12-12 DIAGNOSIS — Z3A37 37 weeks gestation of pregnancy: Secondary | ICD-10-CM

## 2018-12-12 DIAGNOSIS — Z3685 Encounter for antenatal screening for Streptococcus B: Secondary | ICD-10-CM

## 2018-12-12 LAB — POCT URINALYSIS DIPSTICK OB
Glucose, UA: NEGATIVE
POC,PROTEIN,UA: NEGATIVE

## 2018-12-15 NOTE — Progress Notes (Signed)
HROB/ NST at 37weeks for BMI>40 : Some irregular contractions. Occasional brown mucoid discharge. Baby active. Last growth scan EFW 5# (44th%)  on 6/17 with normal AFI NST today is reactive with baseline 135 and many accelerations to 170s to 180s with active baby and moderate variability GC/Chlamydia and GBS done today Wants to bottle feed Contraception: pills Labor precautions given ROB/ NST/ growth scan in 1 week.  Dalia Heading, CNM

## 2018-12-16 LAB — CERVICOVAGINAL ANCILLARY ONLY
Chlamydia: NEGATIVE
Neisseria Gonorrhea: NEGATIVE

## 2018-12-17 LAB — CULTURE, BETA STREP (GROUP B ONLY): Strep Gp B Culture: NEGATIVE

## 2018-12-19 ENCOUNTER — Ambulatory Visit (INDEPENDENT_AMBULATORY_CARE_PROVIDER_SITE_OTHER): Payer: Medicaid Other | Admitting: Obstetrics and Gynecology

## 2018-12-19 ENCOUNTER — Other Ambulatory Visit: Payer: Self-pay

## 2018-12-19 ENCOUNTER — Ambulatory Visit (INDEPENDENT_AMBULATORY_CARE_PROVIDER_SITE_OTHER): Payer: Medicaid Other

## 2018-12-19 VITALS — BP 132/82 | Wt 246.0 lb

## 2018-12-19 DIAGNOSIS — O0990 Supervision of high risk pregnancy, unspecified, unspecified trimester: Secondary | ICD-10-CM

## 2018-12-19 DIAGNOSIS — O99343 Other mental disorders complicating pregnancy, third trimester: Secondary | ICD-10-CM

## 2018-12-19 DIAGNOSIS — Z3A38 38 weeks gestation of pregnancy: Secondary | ICD-10-CM

## 2018-12-19 DIAGNOSIS — Z362 Encounter for other antenatal screening follow-up: Secondary | ICD-10-CM | POA: Diagnosis not present

## 2018-12-19 DIAGNOSIS — F332 Major depressive disorder, recurrent severe without psychotic features: Secondary | ICD-10-CM

## 2018-12-19 DIAGNOSIS — O99210 Obesity complicating pregnancy, unspecified trimester: Secondary | ICD-10-CM

## 2018-12-19 NOTE — Progress Notes (Signed)
  [facility] B[facility]TH[facility] INDUCTION ASSESSMENT SCHEDULING Brenda Hayes August 04, 1995 Medical record #: [id number] Phone #:  Home Phone [phone]  Mobile [phone]    Prenatal Provider:[facility]ivering [facility] Proposed admission date/time:12/26/2018 0800 Method of induction:Cytotec  Weight: Filed Weights07/16/20 1028Weight:246 lb (111.6 kg) BMI Body mass index is 44.99 kg/m. HIV Negative HSV Negative EDC Estimated Date of Delivery: 7/30/20based on:LMP  Gestational age on admission: [redacted]w[redacted]d Gravidity/parity:G2P0010  Cervix Score   0 1 2 3   Position Posterior Midposition Anterior   Consistency Firm Medium Soft   Effacement (%) 0-30 40-50 60-70 >80  Dilation (cm) Closed 1-2 3-4 >5  Baby's station -3 -2 -1 +1, +2   Bishop Score:4   Medical induction of labor  select indication(s) below Elective induction ?39 weeks multiparous patient ?39 weeks primiparous patient with Bishop score ?7 ?40 weeks primiparous patient   Medical Indications Adapted from [place] #[id number], "Medically Indicated Late Preterm and Early Term Deliveries," 2013.  PLACENTAL / UTERINE ISSUES FETAL ISSUES MATERNAL ISSUES  ? Placenta previa (36.0-37.6) ? Isoimmunization (37.0-38.6) ? Preeclampsia without severe features or gestational HTN (37.0)  ? Suspected accreta (34.0-35.6) ? Growth Restriction Brenda Hayes) ? Preeclampsia with severe features (34.0)  ? Prior classical CD, uterine window, rupture (36.0-37.6) ? Isolated (38.0-39.6) ? Chronic HTN (38.0-39.6)  ? Prior myomectomy (37.0-38.6) ? Concurrent findings (34.0-37.6) ? Cholestasis (37.0)  ? 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 Growth Restriction (Twins) ? Diabetes  ? Placental abruption (chronic) ? Di-Di Isolated (36.0-37.6) ? Pregestational, controlled (39.0)  OBSTETRIC ISSUES ? Di-Di concurrent findings (32.0-34.6) ? Pregestational, uncontrolled (37.0-39.0)  ? Postdates ? (41 weeks) ? Mo-Di isolated (32.0-34.6) ?  Pregestational, vascular compromise (37.0- 39.0)  ? PPROM (34.0) ? Multiple Gestation ? Gestational, diet controlled (40.0)  ? Hx of IUFD (39.0 weeks) ? Di-Di (38.0-38.6) ? Gestational, med controlled (39.0)  ? Polyhydramnios, mild/moderate; SDV 8-16 or AFI 25-35 (39.0) ? Mo-Di (36.0-37.6) ? Gestational, uncontrolled (38.0-39.0)  ? Oligohydramnios (36.0-37.6); MVP <2 cm  For indications not listed above, delivery recommendations from maternal-fetal medicine consultant occurred on: N/A  Provider Signature: Malachy Mood  Date:12/19/2018 10:49 AM   Call [phone] to finalize the induction date/time  [id number] (07/17)

## 2018-12-19 NOTE — Progress Notes (Signed)
    Routine Prenatal Care Visit  Subjective  Brenda Hayes is a 23 y.o. G2P0010 at [redacted]w[redacted]d being seen today for ongoing prenatal care.  She is currently monitored for the following issues for this low-risk pregnancy and has Major depressive disorder, recurrent severe without psychotic features ([place]); Self-injurious behavior; Suicide attempt ([place]); OCD (obsessive compulsive disorder); PTSD (post-traumatic stress disorder); Supervision of high risk pregnancy, antepartum; and Obesity affecting pregnancy, antepartum on their problem list.  ----------------------------------------------------------------------------------- Patient reports no complaints.   Contractions: Irregular. Vag. Bleeding: None.  Movement: Present. Denies leaking of fluid.  ----------------------------------------------------------------------------------- The following portions of the patient's history were reviewed and updated as appropriate: allergies, current medications, past family history, past medical history, past social history, past surgical history and problem list. Problem list updated.   Objective  Last menstrual period 03/28/2018. Pregravid weight 210 lb (95.3 kg) Total Weight Gain 36 lb (16.3 kg)  Body mass index is 44.99 kg/m.  Urinalysis:      Fetal Status: Fetal Heart Rate (bpm): 140 Fundal Height: 37 cm Movement: Present     General:  Alert, oriented and cooperative. Patient is in no acute distress.  Skin: Skin is warm and dry. No rash noted.   Cardiovascular: Normal heart rate noted  Respiratory: Normal respiratory effort, no problems with respiration noted  Abdomen: Soft, gravid, appropriate for gestational age. Pain/Pressure: Present     Pelvic:  Cervical exam deferred        Extremities: Normal range of motion.     ental Status: Normal mood and affect. Normal behavior. Normal judgment and thought content.   Baseline:140 Variability: moderate Accelerations: present Decelerations: absent  The patient was monitored for 30 minutes, fetal heart rate tracing was deemed reactive, category I tracing,    Assessment   23 y.o. G2P0010 at [redacted]w[redacted]d by  01/02/2019, by Last Menstrual Period presenting for routine prenatal visit  Plan   pregnancy #2 Problems (from 03/08/18 to present)    Problem Noted Resolved   Supervision of high risk pregnancy, antepartum 05/24/2018 by Malachy Mood, MD No   Overview Addendum 12/15/2018  2:51 PM by Dalia Heading, [place] Prenatal Labs  Dating LMP = 6 week [place] Blood type: O/Positive/-- (12/11 1643)   Genetic Screen NIPS: Normal XY SMA: neg FragileX: neg CF: neg Antibody:Negative (12/11 1643)  Anatomic [place] Incomplete cardiac views, 3VC, nose/lips, 3VC.  Completed on 3/10 follow up Rubella: 4.70 (12/11 1643) Varicella: Immune  GTT 74 RPR: Non Reactive (12/11 1643)   Rhogam none HBsAg: Negative (12/11 1643)   TDaP vaccine 10/16/2018 HIV: Non Reactive (12/11 1643)   Baby Food      Bottle                          GBS:   Contraception Pills GBS prior pregnancy: N/A  Pelvis Tested   Pap: 03/13/18  CS/VBAC N/A   Support Person Larkin Ina              Gestational age appropriate obstetric precautions including but not limited to vaginal bleeding, contractions, leaking of fluid and fetal movement were reviewed in detail with the patient.    - IOL scheduled for 12/26/2018   No follow-ups on file.  Malachy Mood, MD, [facility], [facility] 12/19/2018, 10:49 AM

## 2018-12-19 NOTE — Progress Notes (Signed)
ROB AFI/NST 

## 2018-12-20 ENCOUNTER — Encounter: Payer: Medicaid Other | Admitting: Obstetrics and Gynecology

## 2018-12-24 ENCOUNTER — Ambulatory Visit
Admission: RE | Admit: 2018-12-24 | Discharge: 2018-12-24 | Disposition: A | Discharge status: Home / Self Care | Payer: Managed Care, Other (non HMO) | Source: Ambulatory Visit | Attending: Obstetrics and Gynecology | Admitting: Obstetrics and Gynecology

## 2018-12-24 ENCOUNTER — Other Ambulatory Visit: Payer: Self-pay

## 2018-12-24 DIAGNOSIS — Z1159 Encounter for screening for other viral diseases: Secondary | ICD-10-CM | POA: Insufficient documentation

## 2018-12-24 LAB — SARS CORONAVIRUS 2 (TAT 6-24 HRS): SARS Coronavirus 2: NEGATIVE

## 2018-12-26 ENCOUNTER — Inpatient Hospital Stay
Admission: RE | Admit: 2018-12-26 | Discharge: 2018-12-30 | DRG: 807 | Disposition: A | Discharge status: Home / Self Care | Payer: Managed Care, Other (non HMO) | Attending: Obstetrics and Gynecology | Admitting: Obstetrics and Gynecology

## 2018-12-26 ENCOUNTER — Other Ambulatory Visit: Payer: Self-pay

## 2018-12-26 DIAGNOSIS — O99214 Obesity complicating childbirth: Principal | ICD-10-CM | POA: Diagnosis present

## 2018-12-26 DIAGNOSIS — E669 Obesity, unspecified: Secondary | ICD-10-CM | POA: Diagnosis present

## 2018-12-26 DIAGNOSIS — O99344 Other mental disorders complicating childbirth: Secondary | ICD-10-CM | POA: Diagnosis present

## 2018-12-26 DIAGNOSIS — F329 Major depressive disorder, single episode, unspecified: Secondary | ICD-10-CM | POA: Diagnosis present

## 2018-12-26 DIAGNOSIS — O0990 Supervision of high risk pregnancy, unspecified, unspecified trimester: Secondary | ICD-10-CM

## 2018-12-26 DIAGNOSIS — O26893 Other specified pregnancy related conditions, third trimester: Secondary | ICD-10-CM | POA: Diagnosis present

## 2018-12-26 DIAGNOSIS — F332 Major depressive disorder, recurrent severe without psychotic features: Secondary | ICD-10-CM | POA: Diagnosis present

## 2018-12-26 DIAGNOSIS — O99210 Obesity complicating pregnancy, unspecified trimester: Secondary | ICD-10-CM | POA: Diagnosis present

## 2018-12-26 DIAGNOSIS — Z3A39 39 weeks gestation of pregnancy: Secondary | ICD-10-CM

## 2018-12-26 DIAGNOSIS — Z20828 Contact with and (suspected) exposure to other viral communicable diseases: Secondary | ICD-10-CM | POA: Diagnosis present

## 2018-12-26 DIAGNOSIS — Z3490 Encounter for supervision of normal pregnancy, unspecified, unspecified trimester: Secondary | ICD-10-CM | POA: Diagnosis present

## 2018-12-26 LAB — CBC
HCT: 31.5 % — ABNORMAL LOW (ref 36.0–46.0)
Hemoglobin: 10.7 g/dL — ABNORMAL LOW (ref 12.0–15.0)
MCH: 30.1 pg (ref 26.0–34.0)
MCHC: 34 g/dL (ref 30.0–36.0)
MCV: 88.5 fL (ref 80.0–100.0)
Platelets: 238 10*3/uL (ref 150–400)
RBC: 3.56 MIL/uL — ABNORMAL LOW (ref 3.87–5.11)
RDW: 13.5 % (ref 11.5–15.5)
WBC: 6.6 10*3/uL (ref 4.0–10.5)
nRBC: 0 % (ref 0.0–0.2)

## 2018-12-26 LAB — TYPE AND SCREEN
ABO/RH(D): O POS
Antibody Screen: NEGATIVE

## 2018-12-26 LAB — URINE DRUG SCREEN, QUALITATIVE (ARMC ONLY)
Amphetamines, Ur Screen: NOT DETECTED
Barbiturates, Ur Screen: NOT DETECTED
Benzodiazepine, Ur Scrn: NOT DETECTED
Cannabinoid 50 Ng, Ur ~~LOC~~: NOT DETECTED
Cocaine Metabolite,Ur ~~LOC~~: NOT DETECTED
MDMA (Ecstasy)Ur Screen: NOT DETECTED
Methadone Scn, Ur: NOT DETECTED
Opiate, Ur Screen: NOT DETECTED
Phencyclidine (PCP) Ur S: NOT DETECTED
Tricyclic, Ur Screen: NOT DETECTED

## 2018-12-26 LAB — CHLAMYDIA/NGC RT PCR (ARMC ONLY)
Chlamydia Tr: NOT DETECTED
N gonorrhoeae: NOT DETECTED

## 2018-12-26 MED ORDER — LACTATED RINGERS IV SOLN
INTRAVENOUS | Status: DC
  Administered 2018-12-26 – 2018-12-27 (×5): via INTRAVENOUS

## 2018-12-26 MED ORDER — ONDANSETRON HCL 4 MG/2ML IJ SOLN
4.0000 mg | Freq: Four times a day (QID) | INTRAMUSCULAR | Status: DC | PRN
  Administered 2018-12-27 – 2018-12-28 (×2): 4 mg via INTRAVENOUS
  Filled 2018-12-26 (×2): qty 2

## 2018-12-26 MED ORDER — OXYTOCIN 40 UNITS IN NORMAL SALINE INFUSION - SIMPLE MED
2.5000 [IU]/h | INTRAVENOUS | Status: DC
  Filled 2018-12-26: qty 1000

## 2018-12-26 MED ORDER — LIDOCAINE HCL (PF) 1 % IJ SOLN: 30.0000 mL | INTRAMUSCULAR | Status: DC | PRN

## 2018-12-26 MED ORDER — SOD CITRATE-CITRIC ACID 500-334 MG/5ML PO SOLN: 30.0000 mL | ORAL | Status: DC | PRN

## 2018-12-26 MED ORDER — OXYTOCIN BOLUS FROM INFUSION
500.0000 mL | Freq: Once | INTRAVENOUS | Status: AC
  Administered 2018-12-28: 500 mL via INTRAVENOUS

## 2018-12-26 MED ORDER — ACETAMINOPHEN 325 MG PO TABS: 650.0000 mg | ORAL_TABLET | ORAL | Status: DC | PRN

## 2018-12-26 MED ORDER — TERBUTALINE SULFATE 1 MG/ML IJ SOLN: 0.2500 mg | Freq: Once | INTRAMUSCULAR | Status: DC | PRN

## 2018-12-26 MED ORDER — MISOPROSTOL 100 MCG PO TABS
25.0000 ug | ORAL_TABLET | ORAL | Status: DC | PRN
  Administered 2018-12-26 – 2018-12-27 (×6): 25 ug via VAGINAL
  Filled 2018-12-26 (×8): qty 1

## 2018-12-26 MED ORDER — LACTATED RINGERS IV SOLN: 500.0000 mL | INTRAVENOUS | Status: DC | PRN

## 2018-12-26 NOTE — Progress Notes (Signed)
   Subjective:  No concerns, not feeling any contractions  Objective:   Vitals: Blood pressure 119/75, pulse 78, temperature 98 F (36.7 C), temperature source Oral, resp. rate 16, height 5\' 2"  (1.575 m), weight 102.1 kg, last menstrual period 03/28/2018, SpO2 99 %. General: NAD Abdomen: Gravid, non-tender Cervical Exam:  Dilation: Fingertip Cervical Position: Posterior Presentation: Undeterminable Exam by:: Robin Searing RN  FHT: 130, moderate, +accels, no decels Toco: q1-15min irregular  Results for orders placed or performed during the hospital encounter of 12/26/18 (from the past 24 hour(s))  CBC     Status: Abnormal   Collection Time: 12/26/18  9:00 AM  Result Value Ref Range   WBC 6.6 4.0 - 10.5 K/uL   RBC 3.56 (L) 3.87 - 5.11 MIL/uL   Hemoglobin 10.7 (L) 12.0 - 15.0 g/dL   HCT 31.5 (L) 36.0 - 46.0 %   MCV 88.5 80.0 - 100.0 fL   MCH 30.1 26.0 - 34.0 pg   MCHC 34.0 30.0 - 36.0 g/dL   RDW 13.5 11.5 - 15.5 %   Platelets 238 150 - 400 K/uL   nRBC 0.0 0.0 - 0.2 %  Type and screen     Status: None   Collection Time: 12/26/18  9:00 AM  Result Value Ref Range   ABO/RH(D) O POS    Antibody Screen NEG    Sample Expiration      12/29/2018,2359 Performed at [facility], [address] [place], [place] [address]   Urine Drug Screen, Qualitative ([facility] only)     Status: None   Collection Time: 12/26/18 10:09 AM  Result Value Ref Range   Tricyclic, Ur Screen NONE DETECTED NONE DETECTED   Amphetamines, Ur Screen NONE DETECTED NONE DETECTED   MDMA (Ecstasy)Ur Screen NONE DETECTED NONE DETECTED   Cocaine Metabolite,Ur [place] NONE DETECTED NONE DETECTED   Opiate, Ur Screen NONE DETECTED NONE DETECTED   Phencyclidine (PCP) Ur S NONE DETECTED NONE DETECTED   Cannabinoid 50 Ng, Ur [place] NONE DETECTED NONE DETECTED   Barbiturates, Ur Screen NONE DETECTED NONE DETECTED   Benzodiazepine, Ur Scrn NONE DETECTED NONE DETECTED   Methadone Scn, Ur NONE DETECTED NONE DETECTED   Chlamydia/NGC rt PCR ([facility] only)     Status: None   Collection Time: 12/26/18 10:09 AM  Result Value Ref Range   Specimen source GC/Chlam URINE, RANDOM    Chlamydia Tr NOT DETECTED NOT DETECTED   N gonorrhoeae NOT DETECTED NOT DETECTED    Assessment:   23 y.o. G2P0010 [redacted]w[redacted]d elective IOL  Plan:   1) Labor - continue cytotec induction  2) Fetus - cat I tracing  Malachy Mood, MD, [place], [facility] 12/26/2018, 5:16 PM

## 2018-12-26 NOTE — H&P (Signed)
Obstetrics Admission History & Physical   Elective induction of labor   HPI:  23 y.o. G2P0010 @ 43545w0d (01/02/2019, by Last Menstrual Period). Admitted on 12/26/2018:   Patient Active Problem List   Diagnosis Date Noted  . Encounter for planned induction of labor 12/26/2018  . Encounter for elective induction of labor 12/26/2018  . Obesity affecting pregnancy, antepartum 10/16/2018  . Supervision of high risk pregnancy, antepartum 05/24/2018  . OCD (obsessive compulsive disorder) 03/05/2018  . PTSD (post-traumatic stress disorder) 03/05/2018  . Major depressive disorder, recurrent severe without psychotic features (HCC) 03/04/2018  . Self-injurious behavior 03/04/2018  . Suicide attempt (HCC) 03/04/2018     Presents for elective labor induction at term. She is not feeling contractions. She has not had any vaginal bleeding or loss of fluid. She has occasional nausea and vomiting, not having any this morning. She has edema in her hands and BLE. Baby is moving well.  [facility]. Pregnancy complicated by obesity with BMI>40..  ROS: A review of systems was performed and negative, except as stated in the above HPI.  PMHx:  Past Medical History:  Diagnosis Date  . Anxiety   . Depression    PSHx:  Past Surgical History:  Procedure Laterality Date  . WISDOM TOOTH EXTRACTION     x4 extracted    Medications:  Medications Prior to Admission  Medication Sig Dispense Refill Last Dose  . albuterol (PROVENTIL HFA;VENTOLIN HFA) 108 (90 Base) MCG/ACT inhaler Inhale 1-2 puffs into the lungs every 6 (six) hours as needed for wheezing or shortness of breath. 1 Inhaler 3 Past Week at Unknown time  . ferrous sulfate (FERROUSUL) 325 (65 FE) MG tablet Take 1 tablet (325 mg total) by mouth daily with breakfast. 90 tablet 1 Past Week at Unknown time  . Prenatal MV-Min-FA-Omega-3 (PRENATAL GUMMIES/DHA & FA) 0.4-32.5 MG CHEW Chew by mouth.   12/25/2018 at Unknown time  . ondansetron  (ZOFRAN ODT) 4 MG disintegrating tablet Take 1 tablet (4 mg total) by mouth every 6 (six) hours as needed for nausea. (Patient not taking: Reported on 12/26/2018) 40 tablet 0 Not Taking at Unknown time  . sertraline (ZOLOFT) 50 MG tablet Take 1 tablet (50 mg total) by mouth daily. (Patient not taking: Reported on 12/15/2018) 90 tablet 1    Allergies: is allergic to amoxicillin; penicillins; eggs or egg-derived products; and vancomycin. OBHx:  OB History  Gravida Para Term Preterm AB Living  2 0 0   1 0  SAB TAB Ectopic Multiple Live Births  1            # Outcome Date GA Lbr Len/2nd Weight Sex Delivery Anes PTL Lv  2 Current           1 SAB  5845w0d          FHx:  Family History  Problem Relation Age of Onset  . Breast cancer Paternal Aunt   . Breast cancer Maternal Grandmother    Soc Hx: Never smoker, Alcohol: none and Recreational drug use: none  Objective:   Vitals:   12/26/18 0830  BP: 131/80  Pulse: 89  Resp: 16  Temp: 97.8 F (36.6 C)  SpO2: 99%   Constitutional: Well nourished, well developed female in no acute distress.  HEENT: normal Skin: Warm and dry.  Cardiovascular: Regular rate and rhythm.   Extremity: trace to 1+ bilateral pedal edema Respiratory: Clear to auscultation bilaterally. Normal respiratory effort Abdomen: gravid, non-tender Neuro: Cranial nerves grossly  intact Psych: Alert and Oriented x3. No memory deficits. Normal mood and affect.  MS: normal gait, normal bilateral lower extremity ROM/strength/stability.  Pelvic exam: RN unable to reach cervix, patient did not tolerate exam well. Cytotec was placed.  EFM:FHR: 160 bpm, variability: marked,  accelerations:  Present,  decelerations:  Absent Toco: Uterine irritability   Perinatal info:  Blood type: O positive Rubella- Immune Varicella -Immune TDaP Given during third trimester of this pregnancy RPR NR / HIV Neg/ HBsAg Neg   Assessment & Plan:   23 y.o. G2P0010 @ [redacted]w[redacted]d, Admitted on  12/26/2018 for an elective labor induction.    Observe for cervical change, Fetal Wellbeing Reassuring, Epidural when ready, AROM when Appropriate and GBS status negative, treat as needed.  Avel Sensor, CNM [facility], [facility] 12/26/2018  9:43 AM

## 2018-12-26 NOTE — Progress Notes (Signed)
.     Subjective:  No concerns  Objective:   Vitals: Blood pressure 125/72, pulse 82, temperature 98.3 F (36.8 C), temperature source Oral, resp. rate 18, height 5\' 2"  (1.575 m), weight 102.1 kg, last menstrual period 03/28/2018, SpO2 99 %. General: NAD Abdomen: gravid, non-tender Cervical Exam:  Dilation: Fingertip Effacement (%): 50 Cervical Position: Posterior Station: -3 Presentation: Undeterminable Exam by:: Georgianne Fick, MD  FHT: 120, moderate, +accels, no decels Toco: q2-21min  Results for orders placed or performed during the hospital encounter of 12/26/18 (from the past 24 hour(s))  CBC     Status: Abnormal   Collection Time: 12/26/18  9:00 AM  Result Value Ref Range   WBC 6.6 4.0 - 10.5 K/uL   RBC 3.56 (L) 3.87 - 5.11 MIL/uL   Hemoglobin 10.7 (L) 12.0 - 15.0 g/dL   HCT 31.5 (L) 36.0 - 46.0 %   MCV 88.5 80.0 - 100.0 fL   MCH 30.1 26.0 - 34.0 pg   MCHC 34.0 30.0 - 36.0 g/dL   RDW 13.5 11.5 - 15.5 %   Platelets 238 150 - 400 K/uL   nRBC 0.0 0.0 - 0.2 %  Type and screen     Status: None   Collection Time: 12/26/18  9:00 AM  Result Value Ref Range   ABO/RH(D) O POS    Antibody Screen NEG    Sample Expiration      12/29/2018,2359 Performed at [facility], [address] [place], [place] [address]   Urine Drug Screen, Qualitative ([facility] only)     Status: None   Collection Time: 12/26/18 10:09 AM  Result Value Ref Range   Tricyclic, Ur Screen NONE DETECTED NONE DETECTED   Amphetamines, Ur Screen NONE DETECTED NONE DETECTED   MDMA (Ecstasy)Ur Screen NONE DETECTED NONE DETECTED   Cocaine Metabolite,Ur [place] NONE DETECTED NONE DETECTED   Opiate, Ur Screen NONE DETECTED NONE DETECTED   Phencyclidine (PCP) Ur S NONE DETECTED NONE DETECTED   Cannabinoid 50 Ng, Ur [place] NONE DETECTED NONE DETECTED   Barbiturates, Ur Screen NONE DETECTED NONE DETECTED   Benzodiazepine, Ur Scrn NONE DETECTED NONE DETECTED   Methadone Scn, Ur NONE DETECTED NONE DETECTED   Chlamydia/NGC rt PCR ([facility] only)     Status: None   Collection Time: 12/26/18 10:09 AM  Result Value Ref Range   Specimen source GC/Chlam URINE, RANDOM    Chlamydia Tr NOT DETECTED NOT DETECTED   N gonorrhoeae NOT DETECTED NOT DETECTED    Assessment:   23 y.o. G2P0010 [redacted]w[redacted]d elective IOL  Plan:   1) Labor - continue cytotec  2) Fetus - cat I tacing  Malachy Mood, MD, [place], [place] [facility] 12/26/2018, 10:26 PM

## 2018-12-27 ENCOUNTER — Inpatient Hospital Stay: Payer: Managed Care, Other (non HMO) | Admitting: Registered Nurse

## 2018-12-27 LAB — RPR: RPR Ser Ql: NONREACTIVE

## 2018-12-27 MED ORDER — MISOPROSTOL 200 MCG PO TABS
ORAL_TABLET | ORAL | Status: AC
  Filled 2018-12-27: qty 4

## 2018-12-27 MED ORDER — OXYTOCIN 10 UNIT/ML IJ SOLN
INTRAMUSCULAR | Status: AC
  Filled 2018-12-27: qty 2

## 2018-12-27 MED ORDER — FENTANYL 2.5 MCG/ML W/ROPIVACAINE 0.15% IN NS 100 ML EPIDURAL (ARMC)
EPIDURAL | Status: AC
  Filled 2018-12-27: qty 100

## 2018-12-27 MED ORDER — BUTORPHANOL TARTRATE 1 MG/ML IJ SOLN
1.0000 mg | INTRAMUSCULAR | Status: DC | PRN
  Administered 2018-12-27: 1 mg via INTRAVENOUS
  Filled 2018-12-27: qty 1

## 2018-12-27 MED ORDER — FENTANYL 2.5 MCG/ML W/ROPIVACAINE 0.15% IN NS 100 ML EPIDURAL (ARMC)
EPIDURAL | Status: DC | PRN
  Administered 2018-12-27: 12 mL/h via EPIDURAL
  Administered 2018-12-27: 250 ug via EPIDURAL

## 2018-12-27 MED ORDER — LIDOCAINE HCL (PF) 1 % IJ SOLN
INTRAMUSCULAR | Status: DC | PRN
  Administered 2018-12-27: 3 mL via SUBCUTANEOUS

## 2018-12-27 MED ORDER — AMMONIA AROMATIC IN INHA
RESPIRATORY_TRACT | Status: AC
  Filled 2018-12-27: qty 10

## 2018-12-27 MED ORDER — LIDOCAINE HCL (PF) 1 % IJ SOLN
INTRAMUSCULAR | Status: AC
  Filled 2018-12-27: qty 30

## 2018-12-27 MED ORDER — BUPIVACAINE HCL (PF) 0.25 % IJ SOLN
INTRAMUSCULAR | Status: DC | PRN
  Administered 2018-12-27: 3 mL via EPIDURAL
  Administered 2018-12-27: 4 mL via EPIDURAL

## 2018-12-27 MED ORDER — MISOPROSTOL 25 MCG QUARTER TABLET
25.0000 ug | ORAL_TABLET | ORAL | Status: AC
  Administered 2018-12-27: 25 ug via ORAL

## 2018-12-27 MED ORDER — LIDOCAINE-EPINEPHRINE (PF) 1.5 %-1:200000 IJ SOLN
INTRAMUSCULAR | Status: DC | PRN
  Administered 2018-12-27: 3 mL via EPIDURAL

## 2018-12-27 NOTE — Anesthesia Preprocedure Evaluation (Signed)
Anesthesia Evaluation  Patient identified by MRN, date of birth, ID band Patient awake    Reviewed: Allergy & Precautions, H&P , NPO status , Patient's Chart, lab work & pertinent test results  Airway Mallampati: III  TM Distance: >3 FB Neck ROM: full    Dental no notable dental hx. (+) Teeth Intact   Pulmonary asthma (uses inhaler PRN) ,    Pulmonary exam normal        Cardiovascular negative cardio ROS Normal cardiovascular exam     Neuro/Psych PSYCHIATRIC DISORDERS Anxiety Depression negative neurological ROS     GI/Hepatic negative GI ROS, Neg liver ROS,   Endo/Other  negative endocrine ROS  Renal/GU negative Renal ROS  negative genitourinary   Musculoskeletal   Abdominal   Peds  Hematology negative hematology ROS (+)   Anesthesia Other Findings   Reproductive/Obstetrics (+) Pregnancy                             Anesthesia Physical Anesthesia Plan  ASA: II  Anesthesia Plan: Epidural   Post-op Pain Management:    Induction:   PONV Risk Score and Plan:   Airway Management Planned:   Additional Equipment:   Intra-op Plan:   Post-operative Plan:   Informed Consent: I have reviewed the patients History and Physical, chart, labs and discussed the procedure including the risks, benefits and alternatives for the proposed anesthesia with the patient or authorized representative who has indicated his/her understanding and acceptance.     Dental Advisory Given  Plan Discussed with: Anesthesiologist and CRNA  Anesthesia Plan Comments:         Anesthesia Quick Evaluation

## 2018-12-27 NOTE — Progress Notes (Signed)
Late Progress Note   S: Stadol helped take the edge off the contraction pain  O: 120/69 General: appears uncomfortable  FHR: 130s with accelerations to 150s to 160s, moderate variability Toco: about 2 hours ago began having contractions 1-2 min apart and lasting 60 seconds, and becoming more intense. Last dose of Cytotec around 1030 this AM. After Stadol for pain, contractions are 1-3 minutes apart.  Cervix: 3.5cm/75%/-1  A: Progressing FWB: Cat 1  P: COntinue to monitor progress Add Pitocin if contractions space out Epidural when desires Consider AROM with next check.  Brenda Hayes, CNM

## 2018-12-27 NOTE — Progress Notes (Signed)
Progress Note (Late) Day 2 of IOL for obesity with BMI>40. Received 5 doses of Cytotec since yesterday's admission  S: Having some mild irregular contractions, but mostly cramping in her lower abdomen and hips  O: 123/86 97.6-77-15 General: in NAD, frustrated by lack of progress. Feels better after eating regular diet for breakfast, showering and ambulating this AM.   Ultrasound: ROA  FHR: 140 baseline with accelerations to 160d to 170s, moderate variability Toco: mild irregular contractions, lasting 40 sec. Cervix: posterior/ 60-70%/-1 to -2   A: Unripe cervix, little progress so far  P: Cytotec 25 mcg PV and 25 mcg PO at 1035 Monitor progress and fetal maternal well being.  Dalia Heading, CNM

## 2018-12-27 NOTE — Progress Notes (Signed)
   Subjective:  Comfortable other than some back pain  Objective:   Vitals: Blood pressure 117/78, pulse 73, temperature 98 F (36.7 C), temperature source Oral, resp. rate 19, height 5\' 2"  (1.575 m), weight 102.1 kg, last menstrual period 03/28/2018, SpO2 99 %. General: NAD Abdomen: gravid, non-tender Cervical Exam:  Dilation: Fingertip Effacement (%): 50 Cervical Position: Posterior Station: -3 Presentation: Vertex Exam by:: Georgianne Fick, MD  FHT: 120, moderate, +accels, no decels Toco: q35min  Results for orders placed or performed during the hospital encounter of 12/26/18 (from the past 24 hour(s))  CBC     Status: Abnormal   Collection Time: 12/26/18  9:00 AM  Result Value Ref Range   WBC 6.6 4.0 - 10.5 K/uL   RBC 3.56 (L) 3.87 - 5.11 MIL/uL   Hemoglobin 10.7 (L) 12.0 - 15.0 g/dL   HCT 31.5 (L) 36.0 - 46.0 %   MCV 88.5 80.0 - 100.0 fL   MCH 30.1 26.0 - 34.0 pg   MCHC 34.0 30.0 - 36.0 g/dL   RDW 13.5 11.5 - 15.5 %   Platelets 238 150 - 400 K/uL   nRBC 0.0 0.0 - 0.2 %  RPR     Status: None   Collection Time: 12/26/18  9:00 AM  Result Value Ref Range   RPR Ser Ql Non Reactive Non Reactive  Type and screen     Status: None   Collection Time: 12/26/18  9:00 AM  Result Value Ref Range   ABO/RH(D) O POS    Antibody Screen NEG    Sample Expiration      12/29/2018,2359 Performed at [facility], [address] [place], [place] [address]   Urine Drug Screen, Qualitative ([facility] only)     Status: None   Collection Time: 12/26/18 10:09 AM  Result Value Ref Range   Tricyclic, Ur Screen NONE DETECTED NONE DETECTED   Amphetamines, Ur Screen NONE DETECTED NONE DETECTED   MDMA (Ecstasy)Ur Screen NONE DETECTED NONE DETECTED   Cocaine Metabolite,Ur [place] NONE DETECTED NONE DETECTED   Opiate, Ur Screen NONE DETECTED NONE DETECTED   Phencyclidine (PCP) Ur S NONE DETECTED NONE DETECTED   Cannabinoid 50 Ng, Ur [place] NONE DETECTED NONE DETECTED   Barbiturates, Ur Screen NONE  DETECTED NONE DETECTED   Benzodiazepine, Ur Scrn NONE DETECTED NONE DETECTED   Methadone Scn, Ur NONE DETECTED NONE DETECTED  Chlamydia/NGC rt PCR ([facility] only)     Status: None   Collection Time: 12/26/18 10:09 AM  Result Value Ref Range   Specimen source GC/Chlam URINE, RANDOM    Chlamydia Tr NOT DETECTED NOT DETECTED   N gonorrhoeae NOT DETECTED NOT DETECTED    Assessment:   23 y.o. G2P0010 [redacted]w[redacted]d Elective IOL  Plan:   1) Labor - continue cytotec induction  2) Fetus - category I tracing  Malachy Mood, MD, [place], [place] [facility] 12/27/2018, 2:47 AM

## 2018-12-27 NOTE — Anesthesia Procedure Notes (Signed)
Epidural Patient location during procedure: OB Start time: 12/27/2018 4:20 PM End time: 12/27/2018 4:25 PM  Staffing Anesthesiologist: Gunnar Fusi, MD Resident/CRNA: Hedda Slade, CRNA Performed: anesthesiologist   Preanesthetic Checklist Completed: patient identified, site marked, surgical consent, pre-op evaluation, timeout performed, IV checked, risks and benefits discussed and monitors and equipment checked  Epidural Patient position: sitting Prep: ChloraPrep Patient monitoring: heart rate, continuous pulse ox and blood pressure Approach: midline Location: L3-L4 Injection technique: LOR air  Needle:  Needle type: Tuohy  Needle gauge: 17 G Needle length: 9 cm and 9 Needle insertion depth: 7 cm Catheter type: closed end flexible Catheter size: 19 Gauge Catheter at skin depth: 12 cm Test dose: negative and 1.5% lidocaine with Epi 1:200 K  Assessment Events: blood not aspirated, injection not painful, no injection resistance, negative IV test and no paresthesia  Additional Notes 1 attempt Pt. Evaluated and documentation done after procedure finished. Patient identified. Risks/Benefits/Options discussed with patient including but not limited to bleeding, infection, nerve damage, paralysis, failed block, incomplete pain control, headache, blood pressure changes, nausea, vomiting, reactions to medication both or allergic, itching and postpartum back pain. Confirmed with bedside nurse the patient's most recent platelet count. Confirmed with patient that they are not currently taking any anticoagulation, have any bleeding history or any family history of bleeding disorders. Patient expressed understanding and wished to proceed. All questions were answered. Sterile technique was used throughout the entire procedure. Please see nursing notes for vital signs. Test dose was given through epidural catheter and negative prior to continuing to dose epidural or start infusion. Warning  signs of high block given to the patient including shortness of breath, tingling/numbness in hands, complete motor block, or any concerning symptoms with instructions to call for help. Patient was given instructions on fall risk and not to get out of bed. All questions and concerns addressed with instructions to call with any issues or inadequate analgesia.   Patient tolerated the insertion well without immediate complications.Reason for block:procedure for pain

## 2018-12-28 DIAGNOSIS — Z3A39 39 weeks gestation of pregnancy: Secondary | ICD-10-CM

## 2018-12-28 LAB — CBC
HCT: 33.1 % — ABNORMAL LOW (ref 36.0–46.0)
Hemoglobin: 11.1 g/dL — ABNORMAL LOW (ref 12.0–15.0)
MCH: 30.1 pg (ref 26.0–34.0)
MCHC: 33.5 g/dL (ref 30.0–36.0)
MCV: 89.7 fL (ref 80.0–100.0)
Platelets: 210 10*3/uL (ref 150–400)
RBC: 3.69 MIL/uL — ABNORMAL LOW (ref 3.87–5.11)
RDW: 13.7 % (ref 11.5–15.5)
WBC: 12.9 10*3/uL — ABNORMAL HIGH (ref 4.0–10.5)
nRBC: 0 % (ref 0.0–0.2)

## 2018-12-28 MED ORDER — BUPROPION HCL ER (XL) 150 MG PO TB24
150.0000 mg | ORAL_TABLET | Freq: Every day | ORAL | Status: DC
  Administered 2018-12-28 – 2018-12-30 (×3): 150 mg via ORAL
  Filled 2018-12-28 (×3): qty 1

## 2018-12-28 MED ORDER — DIBUCAINE (PERIANAL) 1 % EX OINT: 1.0000 "application " | TOPICAL_OINTMENT | CUTANEOUS | Status: DC | PRN

## 2018-12-28 MED ORDER — HYDROCODONE-ACETAMINOPHEN 5-325 MG PO TABS: 1.0000 | ORAL_TABLET | Freq: Four times a day (QID) | ORAL | Status: DC | PRN

## 2018-12-28 MED ORDER — IBUPROFEN 600 MG PO TABS
ORAL_TABLET | ORAL | Status: AC
  Filled 2018-12-28: qty 1

## 2018-12-28 MED ORDER — SIMETHICONE 80 MG PO CHEW: 80.0000 mg | CHEWABLE_TABLET | ORAL | Status: DC | PRN

## 2018-12-28 MED ORDER — WITCH HAZEL-GLYCERIN EX PADS: 1.0000 "application " | MEDICATED_PAD | CUTANEOUS | Status: DC | PRN

## 2018-12-28 MED ORDER — DIPHENHYDRAMINE HCL 25 MG PO CAPS: 25.0000 mg | ORAL_CAPSULE | Freq: Four times a day (QID) | ORAL | Status: DC | PRN

## 2018-12-28 MED ORDER — BENZOCAINE-MENTHOL 20-0.5 % EX AERO
1.0000 "application " | INHALATION_SPRAY | CUTANEOUS | Status: DC | PRN
  Administered 2018-12-30: 1 via TOPICAL
  Filled 2018-12-28 (×2): qty 56

## 2018-12-28 MED ORDER — TRAZODONE HCL 50 MG PO TABS
50.0000 mg | ORAL_TABLET | Freq: Every evening | ORAL | Status: DC | PRN
  Filled 2018-12-28: qty 1

## 2018-12-28 MED ORDER — ACETAMINOPHEN 325 MG PO TABS: 650.0000 mg | ORAL_TABLET | ORAL | Status: DC | PRN

## 2018-12-28 MED ORDER — SENNOSIDES-DOCUSATE SODIUM 8.6-50 MG PO TABS
2.0000 | ORAL_TABLET | ORAL | Status: DC
  Administered 2018-12-28 – 2018-12-29 (×2): 2 via ORAL
  Filled 2018-12-28 (×2): qty 2

## 2018-12-28 MED ORDER — IBUPROFEN 600 MG PO TABS
600.0000 mg | ORAL_TABLET | Freq: Four times a day (QID) | ORAL | Status: DC
  Administered 2018-12-28 – 2018-12-30 (×8): 600 mg via ORAL
  Filled 2018-12-28 (×8): qty 1

## 2018-12-28 MED ORDER — FERROUS SULFATE 325 (65 FE) MG PO TABS
325.0000 mg | ORAL_TABLET | Freq: Two times a day (BID) | ORAL | Status: DC
  Administered 2018-12-28 – 2018-12-30 (×5): 325 mg via ORAL
  Filled 2018-12-28 (×5): qty 1

## 2018-12-28 MED ORDER — ONDANSETRON HCL 4 MG PO TABS: 4.0000 mg | ORAL_TABLET | ORAL | Status: DC | PRN

## 2018-12-28 MED ORDER — COCONUT OIL OIL: 1.0000 "application " | TOPICAL_OIL | Status: DC | PRN

## 2018-12-28 MED ORDER — PRENATAL MULTIVITAMIN CH
1.0000 | ORAL_TABLET | Freq: Every day | ORAL | Status: DC
  Administered 2018-12-28 – 2018-12-29 (×2): 1 via ORAL
  Filled 2018-12-28 (×2): qty 1

## 2018-12-28 MED ORDER — ONDANSETRON HCL 4 MG/2ML IJ SOLN: 4.0000 mg | INTRAMUSCULAR | Status: DC | PRN

## 2018-12-28 MED ORDER — ALBUTEROL SULFATE (2.5 MG/3ML) 0.083% IN NEBU: 3.0000 mL | INHALATION_SOLUTION | Freq: Four times a day (QID) | RESPIRATORY_TRACT | Status: DC | PRN

## 2018-12-28 MED ORDER — IBUPROFEN 600 MG PO TABS
600.0000 mg | ORAL_TABLET | Freq: Four times a day (QID) | ORAL | Status: DC
  Administered 2018-12-28: 600 mg via ORAL

## 2018-12-28 MED ORDER — BENZOCAINE-MENTHOL 20-0.5 % EX AERO
INHALATION_SPRAY | CUTANEOUS | Status: AC
  Filled 2018-12-28: qty 56

## 2018-12-28 NOTE — Discharge Summary (Signed)
OB Discharge Summary     Patient Name: Brenda Hayes DOB: 1995-06-27 MRN: [id number]  Date of admission: 12/26/2018 Delivering MD: Prentice Docker, MD  Date of Delivery: 12/28/2018  Date of discharge: 12/29/2018  Admitting diagnosis:  Elective induction of labor Intrauterine pregnancy: [redacted]w[redacted]d     Secondary diagnosis: None     Discharge diagnosis: Term Pregnancy Delivered                                                                                                Post partum procedures:none  Augmentation: Cytotec  Complications: None  Hospital course:  Induction of Labor With Vaginal Delivery   23 y.o. yo G2P0010 at [redacted]w[redacted]d was admitted to the hospital 12/26/2018 for induction of labor.  Indication for induction: Elective induction.  Patient had an uncomplicated labor course as follows: Membrane Rupture Time/Date: 5:30 PM ,12/27/2018   Intrapartum Procedures: Episiotomy: None [1]                                         Lacerations:  2nd degree [3];Vaginal [6]  Patient had delivery of a Viable infant.  Information for the patient's newborn:  [place], [place] [[id number]]  Delivery Method: Vag-Spont    12/28/2018  Details of delivery can be found in separate delivery note.  Patient had a routine postpartum course. Patient is discharged home 12/29/18.  Physical exam  Vitals:   12/28/18 1156 12/28/18 1635 12/29/18 0020 12/29/18 0741  BP: 115/74 116/67 117/80 108/72  Pulse: 85 78 78 82  Resp: 18 20 18 18   Temp: 97.7 F (36.5 C) 97.8 F (36.6 C) 98.2 F (36.8 C) 98.1 F (36.7 C)  TempSrc: Oral Oral Oral Oral  SpO2: 97% 98% 98% 99%  Weight:      Height:       General: alert, cooperative and no distress Lochia: appropriate Uterine Fundus: firm Incision: N/A DVT Evaluation: No evidence of DVT seen on physical exam. Negative Homan's sign.  Labs: Lab Results  Component Value Date   WBC 12.9 (H) 12/28/2018   HGB 11.1 (L) 12/28/2018   HCT 33.1 (L) 12/28/2018    MCV 89.7 12/28/2018   PLT 210 12/28/2018    Discharge instruction: per After Visit Summary.  Medications:  Allergies as of 12/29/2018      Reactions   Amoxicillin Nausea And Vomiting   Penicillins Anaphylaxis   Has patient had a PCN reaction causing immediate rash, facial/tongue/throat swelling, SOB or lightheadedness with hypotension: Yes Has patient had a PCN reaction causing severe rash involving mucus membranes or skin necrosis: Unknown Has patient had a PCN reaction that required hospitalization: Unknown Has patient had a PCN reaction occurring within the last 10 years: No If all of the above answers are "NO", then may proceed with Cephalosporin use.   Eggs Or Egg-derived Products Hives   Vancomycin Hives, Rash      Medication List    STOP taking these medications   ondansetron 4 MG disintegrating tablet Commonly known as: Zofran  ODT   sertraline 50 MG tablet Commonly known as: Zoloft     TAKE these medications   albuterol 108 (90 Base) MCG/ACT inhaler Commonly known as: VENTOLIN HFA Inhale 1-2 puffs into the lungs every 6 (six) hours as needed for wheezing or shortness of breath.   buPROPion 150 MG 24 hr tablet Commonly known as: WELLBUTRIN XL Take 1 tablet (150 mg total) by mouth daily.   ferrous sulfate 325 (65 FE) MG tablet Commonly known as: FerrouSul Take 1 tablet (325 mg total) by mouth daily with breakfast.   Prenatal Gummies/DHA & FA 0.4-32.5 MG Chew Chew by mouth.   traZODone 50 MG tablet Commonly known as: DESYREL Take 1 tablet (50 mg total) by mouth at bedtime as needed for sleep.       Diet: routine diet  Activity: Advance as tolerated. Pelvic rest for 6 weeks.   Outpatient follow up: Follow-up Information    Conard NovakJackson, Stephen D, MD. Schedule an appointment as soon as possible for a visit in 2 week(s).   Specialty: Obstetrics and Gynecology Why: postpartum depression check Contact information: [address] [place]  [id number] [phone]             Postpartum contraception: Pill vs patch, to discuss further at post partum check; info provided Rhogam Given postpartum: no Rubella vaccine given postpartum: no Varicella vaccine given postpartum: no TDaP given antepartum or postpartum: 10/16/2018  Newborn Data: Live born female  Birth Weight: 7 lb 1.2 oz (3210 g) APGAR: 7, 8  Newborn Delivery   Birth date/time: 12/28/2018 00:48:00 Delivery type: Vaginal, Spontaneous       Baby Feeding: Bottle  Disposition:home with mother  SIGNED:  Annamarie MajorPaul Juston Goheen, MD, Merlinda FrederickFACOG [facility], [facility] 12/29/2018  9:30 AM

## 2018-12-28 NOTE — Progress Notes (Signed)
Admit Date: 12/26/2018 Today's Date: 12/28/2018  Post Partum Day 0  Subjective:  no complaints, up ad lib, voiding and tolerating PO  Objective: Temp:  [97.8 F (36.6 C)-98.6 F (37 C)] 98.6 F (37 C) (07/25 0732) Pulse Rate:  [78-237] 80 (07/25 0732) Resp:  [16-18] 18 (07/25 0732) BP: (97-142)/(57-126) 113/75 (07/25 0732) SpO2:  [94 %-99 %] 98 % (07/25 0732)  Physical Exam:  General: alert, cooperative and no distress Lochia: appropriate Uterine Fundus: firm Incision: none DVT Evaluation: No evidence of DVT seen on physical exam. Negative Homan's sign.  Recent Labs    12/26/18 0900 12/28/18 0644  HGB 10.7* 11.1*  HCT 31.5* 33.1*    Assessment/Plan: Bottle Feeding and Infant doing well  Start Wellbutrin due to past h/o depression and higher risk for PPD    Prefers Wellbutrin based on past hx Also desires medicine for sleep    Counseled on need to be alert for infant care Unsure contraception   LOS: 2 days   [place] 12/28/2018, 9:13 AM

## 2018-12-28 NOTE — Lactation Note (Signed)
This note was copied from a baby's chart. Lactation Consultation Note  Patient Name: Brenda Hayes ELFYB'O Date: 12/28/2018   Mom thinking about switching from bottle feeding formula to breast feeding.  Mom concerned that she was having such a hard time getting him to take a bottle of formula.  Switched to slow flow nipple which mom thinks is helping.  Mom had just given 15 ml of formula via bottle when she expressed the desire to try breast feeding.  Mom has firm areola and nipple that slightly inverts when compressed.  Lactation name and number written on white board and encouraged to call for next feeding. Maternal Data    Feeding Feeding Type: Bottle Fed - Formula Nipple Type: Slow - flow  LATCH Score                   Interventions    Lactation Tools Discussed/Used     Consult Status      Jarold Motto 12/28/2018, 4:57 PM

## 2018-12-28 NOTE — Progress Notes (Signed)
Patient inquiring information about breastfeeding. Some breastfeeding basics reviewed. RN consulted Seth Ward. LC to see patient.

## 2018-12-29 MED ORDER — TRAZODONE HCL 50 MG PO TABS: 50.0000 mg | ORAL_TABLET | Freq: Every evening | ORAL | 0 refills | Status: AC | PRN

## 2018-12-29 MED ORDER — BUPROPION HCL ER (XL) 150 MG PO TB24: 150.0000 mg | ORAL_TABLET | Freq: Every day | ORAL | 11 refills | Status: DC

## 2018-12-29 NOTE — Progress Notes (Signed)
Admit Date: 12/26/2018 Today's Date: 12/29/2018  Post Partum Day 1  Subjective:  no complaints, up ad lib, voiding, tolerating PO and + flatus  Objective: Temp:  [97.7 F (36.5 C)-98.2 F (36.8 C)] 98.1 F (36.7 C) (07/26 0741) Pulse Rate:  [78-85] 82 (07/26 0741) Resp:  [18-20] 18 (07/26 0741) BP: (108-117)/(67-80) 108/72 (07/26 0741) SpO2:  [97 %-99 %] 99 % (07/26 0741)  Physical Exam:  General: alert, cooperative and appears stated age Lochia: appropriate Uterine Fundus: firm Incision: none DVT Evaluation: No evidence of DVT seen on physical exam. Negative Homan's sign.  Recent Labs    12/28/18 0644  HGB 11.1*  HCT 33.1*    Assessment/Plan: Discharge home, Contraception (discussed all options; to consider pill or patch and start after her post partum check in 2 weeks), Bottle Feeding and Infant doing well   LOS: 3 days   [place] 12/29/2018, 9:31 AM

## 2018-12-29 NOTE — Progress Notes (Signed)
CSW received consult due to score 13 on Edinburgh Depression Screen.   CSW met with MOB via bedside due to edinburgh score- patient was appropriate and pleasant during conversation. Patient reports feeling nervous and excited about returning home- this is MOB's first child (husband has 24 year old from previous marriage). MOB states she has a history of anxiety and depression but feels she is able to manage it. MOB states she was previously on Wellbutrin before her pregnancy however stopped once she found out she was pregnant- MOB states she thinks starting this medication back will help with her current anxiety. CSW encouraged MOB to evaluate her emotions during post-partum and talk to her supports/ OBGYN if needed. No concerns voiced by MOB at this time.   CSW provided education regarding Baby Blues vs PMADs and provided MOB with resources for mental health follow up.  CSW encouraged MOB to evaluate her mental health throughout the postpartum period with the use of the New Mom Checklist developed by Postpartum Progress as well as the [place] Postnatal Depression Scale and notify a medical professional if symptoms arise.    Kingsley Spittle, LCSW [facility]  [phone]

## 2018-12-29 NOTE — Plan of Care (Signed)
Vs stable; up ad lib except a little "dizzy" this morning; pt did say "I don't really sleep good, i'm afraid"; RN encouraged pt to rest when the baby rests; RN also encouraged pt to make sure and eat a good breakfast; RN encouraged pt to call for assistance out of bed if she still feels dizzy

## 2018-12-29 NOTE — Lactation Note (Addendum)
This note was copied from a baby's chart. Lactation Consultation Note  Patient Name: Brenda Hayes [id number] Date: 12/29/2018   Spoke with mom about her desire to breast feed since she had not contacted lactation since yesterday when she mentioned she might want to try breast feeding.  Mom reports bottle feeding is going much better with spitting and taking the bottle since switched to slow flow nipples.  Mom states she might give the breast now and then.  Explained supply and demand and need to stimulate the breast frequently to bring in mature milk, ensure a plentiful milk supply to satisfy baby and prevent engorgement.  Mom reports just wanting to give bottles of formula now that baby is under phototherapy.  Alternative of pumping offered to mom, but she declined.  She said she would think about it and call me if she changed her mind.    Maternal Data    Feeding Feeding Type: Formula Nipple Type: Slow - flow  LATCH Score                   Interventions    Lactation Tools Discussed/Used     Consult Status      Jarold Motto 12/29/2018, 5:23 PM

## 2018-12-29 NOTE — Progress Notes (Signed)
Phototherapy information discussed and demonstrated with patient. Patient verbalizes understanding of teaching.

## 2018-12-29 NOTE — Anesthesia Postprocedure Evaluation (Signed)
Anesthesia Post Note  Patient: Brenda Hayes  Procedure(s) Performed: AN AD HOC LABOR EPIDURAL  Patient location during evaluation: Mother Baby Anesthesia Type: Epidural Level of consciousness: awake and alert Pain management: pain level controlled Vital Signs Assessment: post-procedure vital signs reviewed and stable Respiratory status: spontaneous breathing, nonlabored ventilation and respiratory function stable Cardiovascular status: stable Postop Assessment: no headache, no backache and epidural receding Anesthetic complications: no     Last Vitals:  Vitals:   12/29/18 0020 12/29/18 0741  BP: 117/80 108/72  Pulse: 78 82  Resp: 18 18  Temp: 36.8 C 36.7 C  SpO2: 98% 99%    Last Pain:  Vitals:   12/29/18 0745  TempSrc:   PainSc: [place]

## 2018-12-30 MED ORDER — NORGESTIM-ETH ESTRAD TRIPHASIC 0.18/0.215/0.25 MG-35 MCG PO TABS: 1.0000 | ORAL_TABLET | Freq: Every day | ORAL | 4 refills | Status: DC

## 2018-12-30 NOTE — Discharge Summary (Signed)
OB Discharge Summary     Patient Name: Brenda FetterCaitlin Marie Flamenco DOB: 12/29/1995 MRN: [id number]  Date of admission: 12/26/2018 Delivering MD: Thomasene MohairStephen Jackson Date of Delivery: 12/28/2018  Date of discharge: 12/30/2018  Admitting diagnosis: Labor Intrauterine pregnancy: 4480w2d     Secondary diagnosis: major depressive disorder     Discharge diagnosis: Term Pregnancy Delivered                                                                                                Post partum procedures: none  Augmentation: Pitocin and Cytotec  Complications: None  Hospital course:  Induction of Labor With Vaginal Delivery   23 y.o. yo G2P1011 at 7780w2d was admitted to the hospital 12/26/2018 for induction of labor.  Indication for induction: elective.  Patient had an uncomplicated labor course as follows: Membrane Rupture Time/Date: 5:30 PM ,12/27/2018   Intrapartum Procedures: Episiotomy: None [1]                                         Lacerations:  2nd degree [3];Vaginal [6]  Patient had delivery of a Viable infant.  Information for the patient's newborn:  Clista BernhardtJustice, Boy Alayne [[id number]][[id number]]  Delivery Method: Vag-Spont    12/28/2018  Details of delivery can be found in separate delivery note.  Patient had a routine postpartum course. Patient is discharged home 12/30/18.  Physical exam  Vitals:   12/29/18 0741 12/29/18 1636 12/29/18 2307 12/30/18 0812  BP: 108/72 120/84 120/76 124/82  Pulse: 82 86 82 71  Resp: 18 18 20 20   Temp: 98.1 F (36.7 C) 98.4 F (36.9 C) 97.6 F (36.4 C) 97.6 F (36.4 C)  TempSrc: Oral Oral Oral Oral  SpO2: 99% 98% 99% 100%  Weight:      Height:       General: alert, cooperative and no distress Lochia: appropriate Uterine Fundus: firm Incision: N/A DVT Evaluation: No evidence of DVT seen on physical exam.  Labs: Lab Results  Component Value Date   WBC 12.9 (H) 12/28/2018   HGB 11.1 (L) 12/28/2018   HCT 33.1 (L) 12/28/2018   MCV 89.7 12/28/2018   PLT  210 12/28/2018    Discharge instruction: per After Visit Summary.  Medications:  Allergies as of 12/30/2018      Reactions   Amoxicillin Nausea And Vomiting   Penicillins Anaphylaxis   Has patient had a PCN reaction causing immediate rash, facial/tongue/throat swelling, SOB or lightheadedness with hypotension: Yes Has patient had a PCN reaction causing severe rash involving mucus membranes or skin necrosis: Unknown Has patient had a PCN reaction that required hospitalization: Unknown Has patient had a PCN reaction occurring within the last 10 years: No If all of the above answers are "NO", then may proceed with Cephalosporin use.   Eggs Or Egg-derived Products Hives   Vancomycin Hives, Rash      Medication List    STOP taking these medications   ondansetron 4 MG disintegrating tablet Commonly known as: Zofran ODT   sertraline 50  MG tablet Commonly known as: Zoloft     TAKE these medications   albuterol 108 (90 Base) MCG/ACT inhaler Commonly known as: VENTOLIN HFA Inhale 1-2 puffs into the lungs every 6 (six) hours as needed for wheezing or shortness of breath.   buPROPion 150 MG 24 hr tablet Commonly known as: WELLBUTRIN XL Take 1 tablet (150 mg total) by mouth daily.   ferrous sulfate 325 (65 FE) MG tablet Commonly known as: FerrouSul Take 1 tablet (325 mg total) by mouth daily with breakfast.   Norgestimate-Ethinyl Estradiol Triphasic 0.18/0.215/0.25 MG-35 MCG tablet Commonly known as: Ortho Tri-Cyclen (28) Take 1 tablet by mouth daily. Start taking on: January 12, 2019   Prenatal Gummies/DHA & FA 0.4-32.5 MG Chew Chew by mouth.   traZODone 50 MG tablet Commonly known as: DESYREL Take 1 tablet (50 mg total) by mouth at bedtime as needed for sleep.       Diet: routine diet  Activity: Advance as tolerated. Pelvic rest for 6 weeks.   Outpatient follow up: Follow-up Information    Will Bonnet, MD. Go on 01/13/2019.   Specialty: Obstetrics and  Gynecology Why: Postpartum follow up appoimtment on Monday August 10th at 11:30 AM with Dr. Glennon Mac for a mood check Contact information: [address] [place] [address] [phone]             Postpartum contraception: Combination OCPs Rhogam Given postpartum: NA Rubella vaccine given postpartum: immune Varicella vaccine given postpartum: immune TDaP given antepartum or postpartum: given antepartum  Newborn Data: Live born female  Birth Weight: 7 lb 1.2 oz (3210 g) APGAR: 7, 8  Newborn Delivery   Birth date/time: 12/28/2018 00:48:00 Delivery type: Vaginal, Spontaneous       Baby Feeding: formula  Disposition:home with mother  SIGNED:  Rod Can, CNM 12/30/2018 11:19 AM

## 2018-12-30 NOTE — Progress Notes (Signed)
Discharge order received from doctor. Reviewed discharge instructions and prescriptions with patient and answered all questions. Follow up appointment given. Patient verbalized understanding. ID bands checked. Patient discharged home with infant via wheelchair by nursing/auxillary.   Julee Stoll Garner, RN  

## 2018-12-30 NOTE — Discharge Instructions (Signed)
Please call your doctor or return to the ER if you experience any chest pains, shortness of breath, dizziness, visual changes, fever greater than 101, any heavy bleeding (saturating more than 1 pad per hour), large clots, or foul smelling discharge, any worsening abdominal pain and cramping that is not controlled by pain medication, any breast concerns (pain/redness), any calf pain/redness, or any signs of postpartum depression. No tampons, enemas, douches, or sexual intercourse for 6 weeks. Also avoid tub baths, hot tubs, or swimming for 6 weeks.

## 2019-01-13 ENCOUNTER — Other Ambulatory Visit: Payer: Self-pay

## 2019-01-13 ENCOUNTER — Ambulatory Visit (INDEPENDENT_AMBULATORY_CARE_PROVIDER_SITE_OTHER): Payer: Managed Care, Other (non HMO) | Admitting: Obstetrics and Gynecology

## 2019-01-13 ENCOUNTER — Encounter: Payer: Self-pay | Admitting: Obstetrics and Gynecology

## 2019-01-13 VITALS — BP 118/78 | Wt 221.0 lb

## 2019-01-13 DIAGNOSIS — F4310 Post-traumatic stress disorder, unspecified: Secondary | ICD-10-CM

## 2019-01-13 DIAGNOSIS — F332 Major depressive disorder, recurrent severe without psychotic features: Secondary | ICD-10-CM

## 2019-01-13 DIAGNOSIS — F419 Anxiety disorder, unspecified: Secondary | ICD-10-CM

## 2019-01-13 NOTE — Progress Notes (Signed)
Obstetrics & Gynecology Office Visit   Chief Complaint  Patient presents with  . Postpartum Care  Postpartum depression check  History of Present Illness: 23 y.o. G58P1011 female who is 2 weeks postpartum from an SVD.  She states that she has had some difficulties with mood postpartum.  Prior to pregnancy she was on an array of medication for her severe anxiety and depression.  She has a psychiatrist that she has not seen since her pregnancy.  She was on multiple medications prior to pregnancy and stopped them with pregnancy.  She states that she feels okay at this moment.  She has had some episodes of depression and anxiety.  She denies suicidal and homicidal ideation.  She states that she has a history of cutting herself and has thought of doing this in the past couple weeks, but has no plans to do so and would not do it.  She is not breast-feeding currently.  Her EPDS score today is 21 and she indicated 0 on the last question, which is thoughts of harming yourself, indicating she has no desire to harm herself or kill herself.    Past Medical History:  Diagnosis Date  . Anxiety   . Depression     Past Surgical History:  Procedure Laterality Date  . WISDOM TOOTH EXTRACTION     x4 extracted     Gynecologic History: No LMP recorded.  Obstetric History: G2P1011  Family History  Problem Relation Age of Onset  . Breast cancer Paternal Aunt   . Breast cancer Maternal Grandmother     Social History   Socioeconomic History  . Marital status: Legally Separated    Spouse name: Not on file  . Number of children: Not on file  . Years of education: Not on file  . Highest education level: Not on file  Occupational History  . Not on file  Social Needs  . Financial resource strain: Not on file  . Food insecurity    Worry: Not on file    Inability: Not on file  . Transportation needs    Medical: Not on file    Non-medical: Not on file  Tobacco Use  . Smoking status: Never Smoker   . Smokeless tobacco: Never Used  Substance and Sexual Activity  . Alcohol use: No  . Drug use: No  . Sexual activity: Yes    Birth control/protection: None  Lifestyle  . Physical activity    Days per week: Not on file    Minutes per session: Not on file  . Stress: Not on file  Relationships  . Social Herbalist on phone: Not on file    Gets together: Not on file    Attends religious service: Not on file    Active member of club or organization: Not on file    Attends meetings of clubs or organizations: Not on file    Relationship status: Not on file  . Intimate partner violence    Fear of current or ex partner: Not on file    Emotionally abused: Not on file    Physically abused: Not on file    Forced sexual activity: Not on file  Other Topics Concern  . Not on file  Social History Narrative  . Not on file    Allergies  Allergen Reactions  . Amoxicillin Nausea And Vomiting  . Penicillins Anaphylaxis    Has patient had a PCN reaction causing immediate rash, facial/tongue/throat swelling, SOB or lightheadedness with  hypotension: Yes Has patient had a PCN reaction causing severe rash involving mucus membranes or skin necrosis: Unknown Has patient had a PCN reaction that required hospitalization: Unknown Has patient had a PCN reaction occurring within the last 10 years: No If all of the above answers are "NO", then may proceed with Cephalosporin use.   . Eggs Or Egg-Derived Products Hives  . Vancomycin Hives and Rash    Prior to Admission medications   Medication Sig Start Date End Date Taking? Authorizing Provider  buPROPion (WELLBUTRIN XL) 150 MG 24 hr tablet Take 1 tablet (150 mg total) by mouth daily. 12/29/18  Yes Nadara MustardHarris, Robert P, MD  traZODone (DESYREL) 50 MG tablet Take 1 tablet (50 mg total) by mouth at bedtime as needed for sleep. 12/29/18  Yes Nadara MustardHarris, Robert P, MD  albuterol (PROVENTIL HFA;VENTOLIN HFA) 108 (90 Base) MCG/ACT inhaler Inhale 1-2 puffs  into the lungs every 6 (six) hours as needed for wheezing or shortness of breath. Patient not taking: Reported on 01/13/2019 06/26/18   Vena AustriaStaebler, Andreas, MD  ferrous sulfate (FERROUSUL) 325 (65 FE) MG tablet Take 1 tablet (325 mg total) by mouth daily with breakfast. Patient not taking: Reported on 01/13/2019 10/17/18   Vena AustriaStaebler, Andreas, MD  Norgestimate-Ethinyl Estradiol Triphasic (ORTHO TRI-CYCLEN, 28,) 0.18/0.215/0.25 MG-35 MCG tablet Take 1 tablet by mouth daily. Patient not taking: Reported on 01/13/2019 01/12/19   Tresea MallGledhill, Jane, CNM  Prenatal MV-Min-FA-Omega-3 (PRENATAL GUMMIES/DHA & FA) 0.4-32.5 MG CHEW Chew by mouth.    [provider]    Review of Systems  Constitutional: Negative.   HENT: Negative.   Eyes: Negative.   Respiratory: Negative.   Cardiovascular: Negative.   Gastrointestinal: Negative.   Genitourinary: Negative.   Musculoskeletal: Negative.   Skin: Negative.   Neurological: Negative.   Psychiatric/Behavioral: Positive for depression. Negative for hallucinations, memory loss, substance abuse and suicidal ideas. The patient is nervous/anxious. The patient does not have insomnia.      Physical Exam BP 118/78   Wt 221 lb (100.2 kg)   BMI 40.42 kg/m  No LMP recorded. Physical Exam Constitutional:      General: She is not in acute distress.    Appearance: Normal appearance.  HENT:     Head: Normocephalic and atraumatic.  Eyes:     General: No scleral icterus.    Conjunctiva/sclera: Conjunctivae normal.  Neurological:     General: No focal deficit present.     Mental Status: She is alert and oriented to person, place, and time.     Cranial Nerves: No cranial nerve deficit.  Psychiatric:        Mood and Affect: Mood normal.        Behavior: Behavior normal.        Judgment: Judgment normal.    Assessment: 23 y.o. 412P1011 female here for  1. Major depressive disorder, recurrent severe without psychotic features (HCC)   2. PTSD (post-traumatic stress  disorder)   3. Anxiety      Plan: Problem List Items Addressed This Visit      Other   Major depressive disorder, recurrent severe without psychotic features (HCC) - Primary   PTSD (post-traumatic stress disorder)    Other Visit Diagnoses    Anxiety         We had a long discussion regarding her symptoms.  I recommended that she contact her psychiatrist and establish care to restart medications that she took prior to pregnancy.  She states that this should be easy for her  to do and we both agree that this would be a good idea.  She understands that if she develops suicidal or homicidal ideation or a plan to harm herself in any way, she should go to the emergency room for emergency treatment.  We both agree that restarting medication today is not indicated, given how she feels.  She would like to wait until she can discuss with her psychiatrist, as it sounds as if she had multiple medications that she was taking to help with her mood symptoms.  Thomasene MohairStephen Tra Wilemon, MD 01/14/2019 1:09 PM

## 2019-01-14 ENCOUNTER — Encounter: Payer: Self-pay | Admitting: Obstetrics and Gynecology

## 2019-02-11 ENCOUNTER — Other Ambulatory Visit: Payer: Self-pay | Admitting: Obstetrics & Gynecology

## 2019-02-11 ENCOUNTER — Other Ambulatory Visit: Payer: Self-pay

## 2019-02-11 ENCOUNTER — Encounter: Payer: Self-pay | Admitting: Obstetrics and Gynecology

## 2019-02-11 ENCOUNTER — Ambulatory Visit (INDEPENDENT_AMBULATORY_CARE_PROVIDER_SITE_OTHER): Payer: Managed Care, Other (non HMO) | Admitting: Obstetrics and Gynecology

## 2019-02-11 DIAGNOSIS — F419 Anxiety disorder, unspecified: Secondary | ICD-10-CM

## 2019-02-11 DIAGNOSIS — Z1389 Encounter for screening for other disorder: Secondary | ICD-10-CM | POA: Diagnosis not present

## 2019-02-11 DIAGNOSIS — F332 Major depressive disorder, recurrent severe without psychotic features: Secondary | ICD-10-CM

## 2019-02-11 MED ORDER — BUPROPION HCL ER (XL) 300 MG PO TB24: 300.0000 mg | ORAL_TABLET | Freq: Every day | ORAL | 1 refills | Status: AC

## 2019-02-11 NOTE — Progress Notes (Signed)
Postpartum Visit  Chief Complaint:  Chief Complaint  Patient presents with  . Postpartum Care    History of Present Illness: Patient is a 23 y.o. G2P1011 presents for postpartum visit.  Date of delivery: 12/28/2018 Type of delivery: Vaginal delivery - Vacuum or forceps assisted  no Episiotomy No.  Laceration: 2nd degree Pregnancy or labor problems:  no Any problems since the delivery:  Depression  Newborn Details:  SINGLETON :  1. Baby's name: Christian. Birth weight: 7.1lb Maternal Details:  Breast Feeding:  Bottle Post partum depression/anxiety noted:  Depression, on medication  Edinburgh Post-Partum Depression Score:  20 (notes temptation to "cut" herself). She has not seen her psychiatrist.  Recommend she call ASAP and try for a telemedicine visit.  I will increase her Wellbutrin in the mean time.  Date of last PAP: 03/13/2018 - Neg/Normal  Past Medical History:  Diagnosis Date  . Anxiety   . Depression     Past Surgical History:  Procedure Laterality Date  . WISDOM TOOTH EXTRACTION     x4 extracted     Prior to Admission medications   Medication Sig Start Date End Date Taking? Authorizing Provider  albuterol (PROVENTIL HFA;VENTOLIN HFA) 108 (90 Base) MCG/ACT inhaler Inhale 1-2 puffs into the lungs every 6 (six) hours as needed for wheezing or shortness of breath. 06/26/18  Yes Vena AustriaStaebler, Andreas, MD  buPROPion (WELLBUTRIN XL) 150 MG 24 hr tablet Take 1 tablet (150 mg total) by mouth daily. 12/29/18  Yes Nadara MustardHarris, Robert P, MD  traZODone (DESYREL) 50 MG tablet Take 1 tablet (50 mg total) by mouth at bedtime as needed for sleep. 12/29/18  Yes Nadara MustardHarris, Robert P, MD  ferrous sulfate (FERROUSUL) 325 (65 FE) MG tablet Take 1 tablet (325 mg total) by mouth daily with breakfast. Patient not taking: Reported on 01/13/2019 10/17/18   Vena AustriaStaebler, Andreas, MD  Norgestimate-Ethinyl Estradiol Triphasic (ORTHO TRI-CYCLEN, 28,) 0.18/0.215/0.25 MG-35 MCG tablet Take 1 tablet by mouth daily.  Patient not taking: Reported on 01/13/2019 01/12/19   Tresea MallGledhill, Jane, CNM  Prenatal MV-Min-FA-Omega-3 (PRENATAL GUMMIES/DHA & FA) 0.4-32.5 MG CHEW Chew by mouth.    [provider]    Allergies  Allergen Reactions  . Amoxicillin Nausea And Vomiting  . Penicillins Anaphylaxis    Has patient had a PCN reaction causing immediate rash, facial/tongue/throat swelling, SOB or lightheadedness with hypotension: Yes Has patient had a PCN reaction causing severe rash involving mucus membranes or skin necrosis: Unknown Has patient had a PCN reaction that required hospitalization: Unknown Has patient had a PCN reaction occurring within the last 10 years: No If all of the above answers are "NO", then may proceed with Cephalosporin use.   . Eggs Or Egg-Derived Products Hives  . Vancomycin Hives and Rash     Social History   Socioeconomic History  . Marital status: Legally Separated    Spouse name: Not on file  . Number of children: Not on file  . Years of education: Not on file  . Highest education level: Not on file  Occupational History  . Not on file  Social Needs  . Financial resource strain: Not on file  . Food insecurity    Worry: Not on file    Inability: Not on file  . Transportation needs    Medical: Not on file    Non-medical: Not on file  Tobacco Use  . Smoking status: Never Smoker  . Smokeless tobacco: Never Used  Substance and Sexual Activity  . Alcohol use: No  .  Drug use: No  . Sexual activity: Yes    Birth control/protection: None  Lifestyle  . Physical activity    Days per week: Not on file    Minutes per session: Not on file  . Stress: Not on file  Relationships  . Social Musician on phone: Not on file    Gets together: Not on file    Attends religious service: Not on file    Active member of club or organization: Not on file    Attends meetings of clubs or organizations: Not on file    Relationship status: Not on file  . Intimate partner  violence    Fear of current or ex partner: Not on file    Emotionally abused: Not on file    Physically abused: Not on file    Forced sexual activity: Not on file  Other Topics Concern  . Not on file  Social History Narrative  . Not on file    Family History  Problem Relation Age of Onset  . Breast cancer Paternal Aunt   . Breast cancer Maternal Grandmother     Review of Systems  Constitutional: Negative.   HENT: Negative.   Eyes: Negative.   Respiratory: Negative.   Cardiovascular: Negative.   Gastrointestinal: Negative.   Genitourinary: Negative.   Musculoskeletal: Negative.   Skin: Negative.   Neurological: Negative.   Psychiatric/Behavioral: Positive for depression. Negative for hallucinations, memory loss, substance abuse and suicidal ideas. The patient is nervous/anxious and has insomnia.      Physical Exam BP 128/84   Ht 5\' 2"  (1.575 m)   Wt 228 lb (103.4 kg)   LMP 02/07/2019   BMI 41.70 kg/m   Physical Exam Constitutional:      General: She is not in acute distress.    Appearance: Normal appearance. She is well-developed.  Genitourinary:     Pelvic exam was performed with patient in the lithotomy position.     Vulva, inguinal canal, urethra, bladder, vagina, uterus, right adnexa and left adnexa normal.     No posterior fourchette tenderness, injury or lesion present.     No cervical friability, lesion, bleeding or polyp.  HENT:     Head: Normocephalic and atraumatic.  Eyes:     General: No scleral icterus.    Conjunctiva/sclera: Conjunctivae normal.  Neck:     Musculoskeletal: Normal range of motion and neck supple.  Cardiovascular:     Rate and Rhythm: Normal rate and regular rhythm.     Heart sounds: No murmur. No friction rub. No gallop.   Pulmonary:     Effort: Pulmonary effort is normal. No respiratory distress.     Breath sounds: Normal breath sounds. No wheezing or rales.  Abdominal:     General: Bowel sounds are normal. There is no  distension.     Palpations: Abdomen is soft. There is no mass.     Tenderness: There is no abdominal tenderness. There is no guarding or rebound.  Musculoskeletal: Normal range of motion.  Neurological:     General: No focal deficit present.     Mental Status: She is alert and oriented to person, place, and time.     Cranial Nerves: No cranial nerve deficit.  Skin:    General: Skin is warm and dry.     Findings: No erythema.  Psychiatric:        Mood and Affect: Mood normal.        Behavior: Behavior normal.  Judgment: Judgment normal.      Female Chaperone present during breast and/or pelvic exam.  Assessment: 23 y.o. G2P1011 presenting for 6 week postpartum visit  Plan: Problem List Items Addressed This Visit      Other   Major depressive disorder, recurrent severe without psychotic features (HCC)   Relevant Medications   buPROPion (WELLBUTRIN XL) 300 MG 24 hr tablet    Other Visit Diagnoses    Postpartum care and examination    -  Primary   Relevant Medications   buPROPion (WELLBUTRIN XL) 300 MG 24 hr tablet   Anxiety       Relevant Medications   buPROPion (WELLBUTRIN XL) 300 MG 24 hr tablet       1) Contraception Education given: Declines all forms at this time.  2)  Pap - ASCCP guidelines and rational discussed.  Patient opts for routine screening interval  3) Patient underwent screening for postpartum depression with some big concerns noted. I highly recommended she see her psychiatrist as soon as possible. Will increase Wellbutrin in the mean time to 300 mg.  Re-visited going to ER should she develop desire to harm herself or others.   4) Follow up 1 year for routine annual exam  Prentice Docker, MD 02/11/2019 10:00 AM

## 2019-06-02 ENCOUNTER — Telehealth (HOSPITAL_COMMUNITY): Payer: Self-pay | Admitting: Professional

## 2019-06-03 ENCOUNTER — Telehealth (HOSPITAL_COMMUNITY): Payer: Self-pay | Admitting: Professional

## 2019-06-03 ENCOUNTER — Ambulatory Visit (HOSPITAL_COMMUNITY): Payer: Self-pay

## 2019-09-08 IMAGING — US US OB COMP LESS 14 WK
1 series · 14 of 28 positions shown · non-contrast
Comparison: 05/11/2018

CLINICAL DATA: Pelvic pain. Current assigned gestational age of 11
weeks 0 days by prior ultrasound.

EXAM:
OBSTETRIC <14 WK ULTRASOUND
TECHNIQUE: Transabdominal ultrasound was performed for evaluation of the
gestation as well as the maternal uterus and adnexal regions.

[Series 1: us ob comp less 14 wk · 0.15mm/px · 14 of 56 slices shown]
[im 3/56]
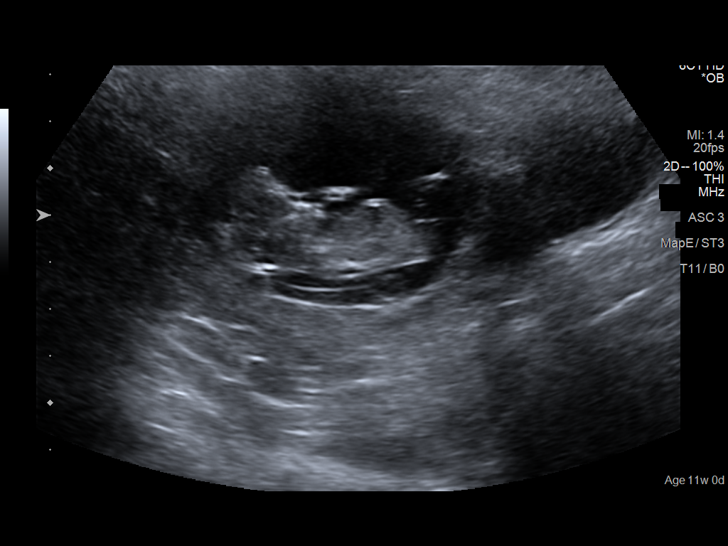
[im 7/56]
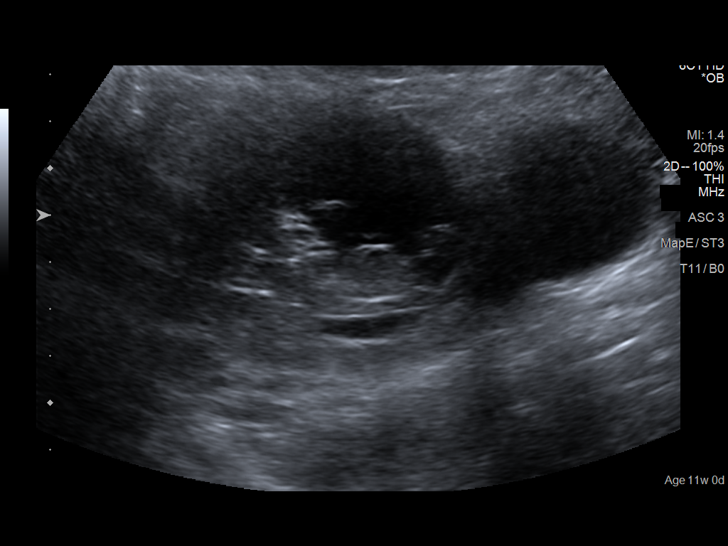
[im 11/56]
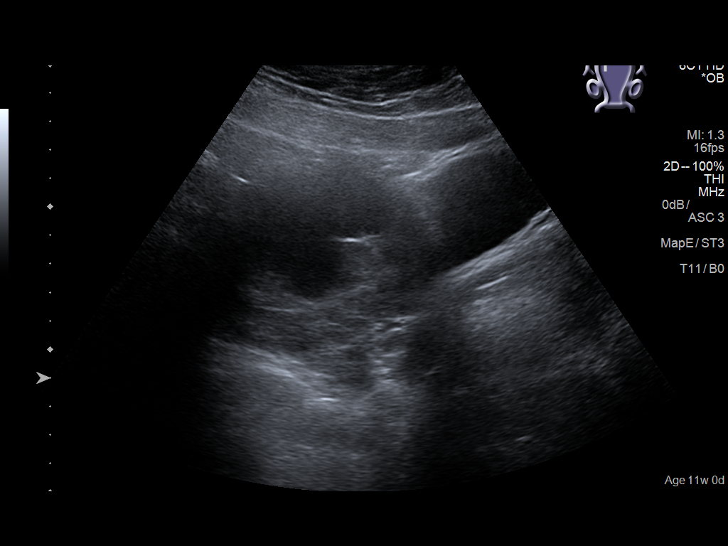
[im 15/56]
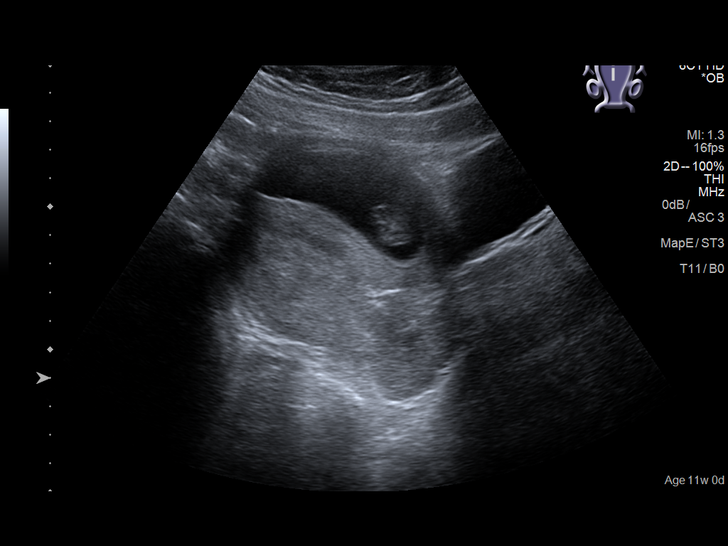
[im 19/56]
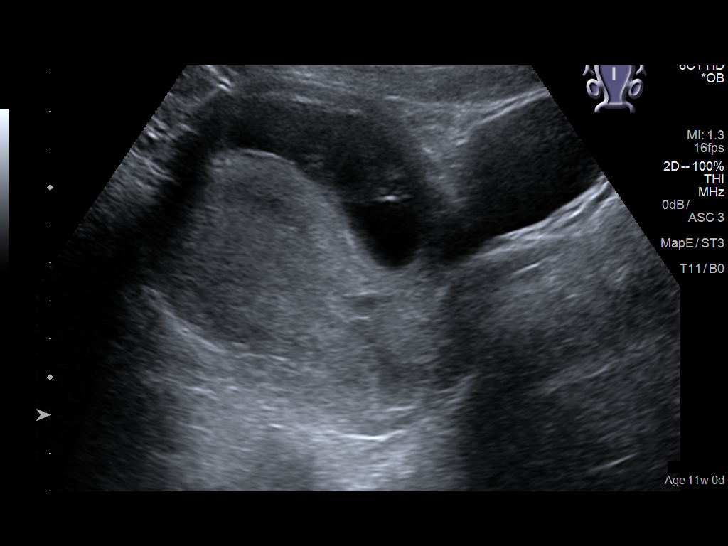
[im 23/56]
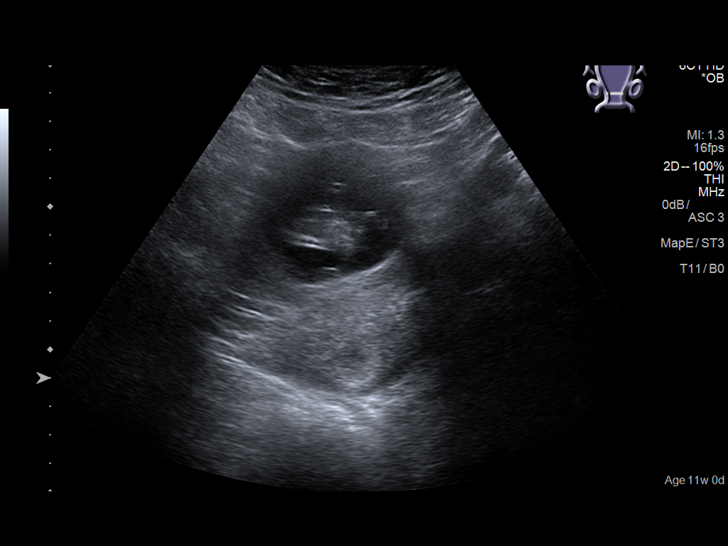
[im 27/56]
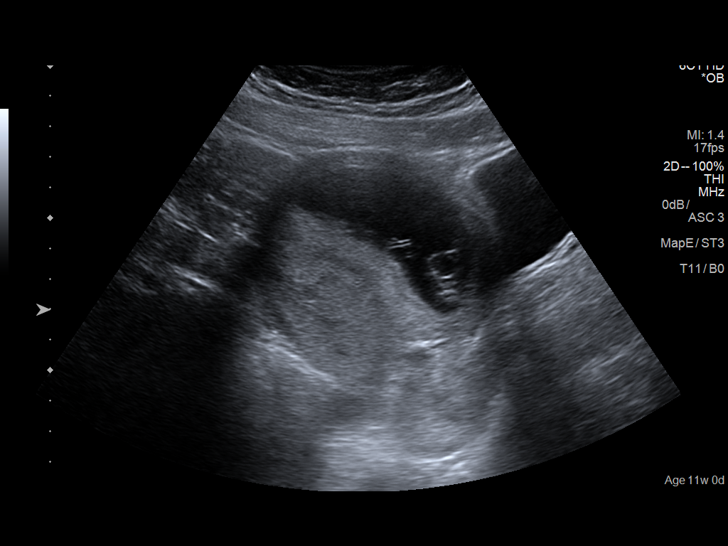
[im 31/56]
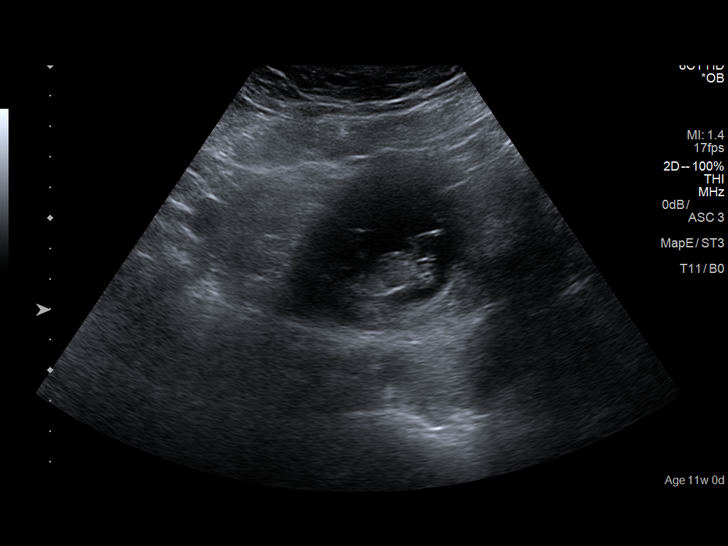
[im 35/56]
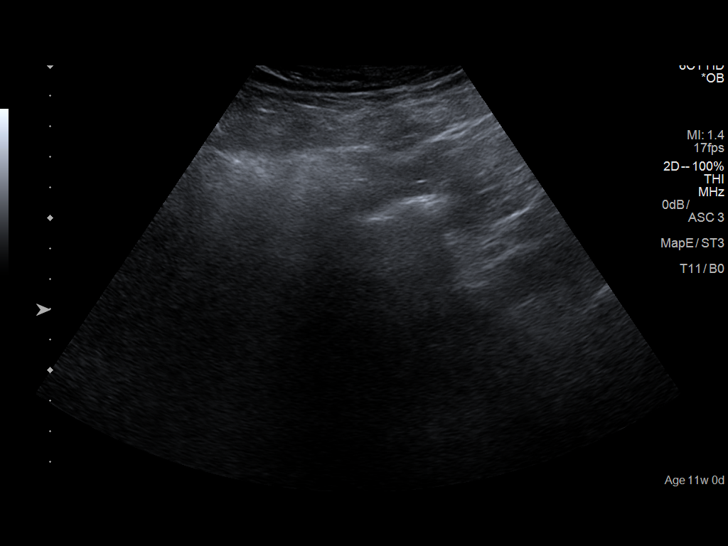
[im 39/56]
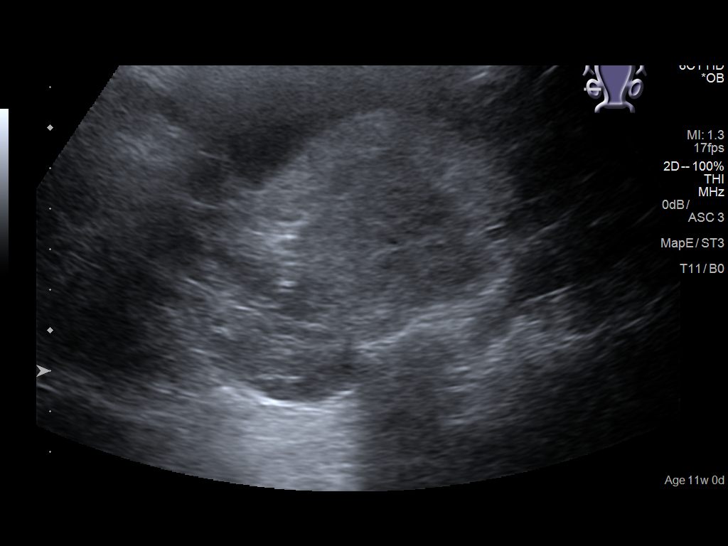
[im 43/56]
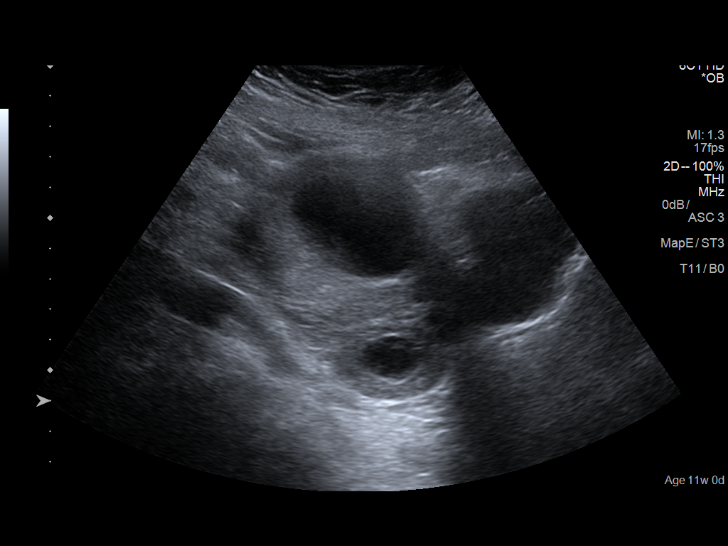
[im 47/56]
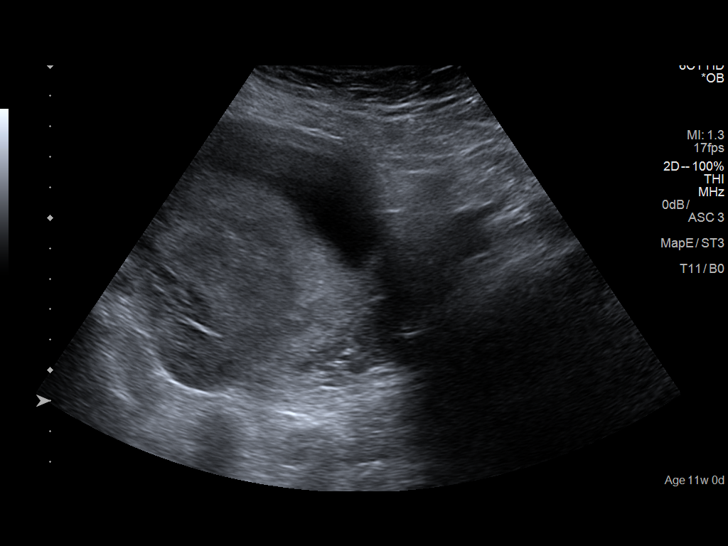
[im 51/56]
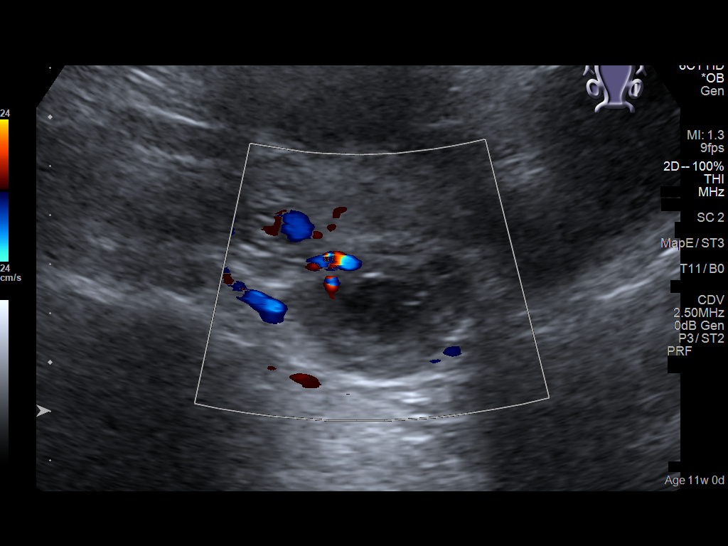
[im 56/56]
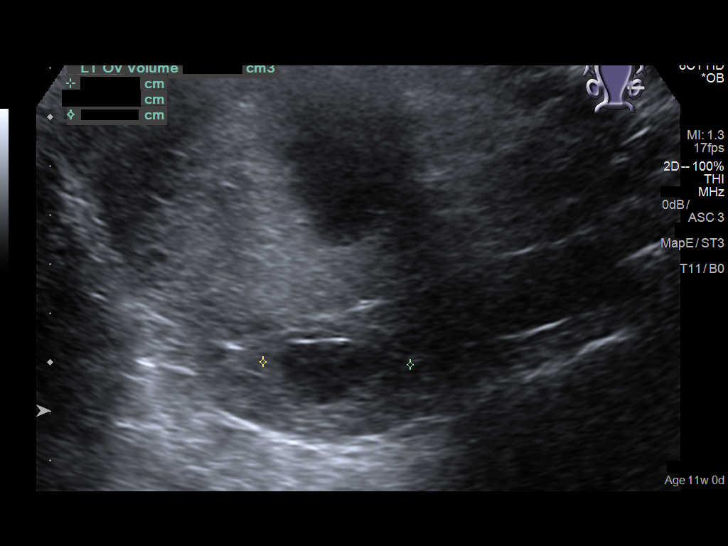

[14 of 28 positions shown; findings below may reference images not displayed]

FINDINGS: Intrauterine gestational sac: Single

Yolk sac:  Visualized.

Embryo:  Visualized.

Cardiac Activity: Visualized.

Heart Rate: 171 bpm

CRL:   47 mm   11 w 3 d                  US EDC: 12/30/2018

Subchorionic hemorrhage:  None visualized.

Maternal uterus/adnexae: Small left ovarian corpus luteum cyst
noted. Normal appearance of right ovary. No mass or abnormal free
fluid identified.
IMPRESSION: Current assigned gestational age of 11 weeks 0 days. Appropriate
fetal growth.

No significant maternal uterine or adnexal abnormality identified.
